# Patient Record
Sex: Male | Born: 1995 | Race: Black or African American | Hispanic: No | Marital: Single | Smoking: Never smoker
Health system: Southern US, Community
[De-identification: ages and names within clinical notes are randomized; demographics above are authoritative.]

## PROBLEM LIST (undated history)

## (undated) DIAGNOSIS — J40 Bronchitis, not specified as acute or chronic: Secondary | ICD-10-CM

## (undated) DIAGNOSIS — N4400 Torsion of testis, unspecified: Secondary | ICD-10-CM

## (undated) HISTORY — PX: OTHER SURGICAL HISTORY: SHX169

---
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
## 2014-01-22 ENCOUNTER — Emergency Department (HOSPITAL_COMMUNITY)
Admission: EM | Admit: 2014-01-22 | Discharge: 2014-01-22 | Disposition: A | Discharge status: Home / Self Care | Payer: Medicaid Other | Attending: Emergency Medicine | Admitting: Emergency Medicine

## 2014-01-22 ENCOUNTER — Encounter (HOSPITAL_COMMUNITY): Payer: Self-pay | Admitting: Emergency Medicine

## 2014-01-22 DIAGNOSIS — K0889 Other specified disorders of teeth and supporting structures: Secondary | ICD-10-CM

## 2014-01-22 DIAGNOSIS — Z8709 Personal history of other diseases of the respiratory system: Secondary | ICD-10-CM | POA: Insufficient documentation

## 2014-01-22 DIAGNOSIS — K029 Dental caries, unspecified: Secondary | ICD-10-CM | POA: Insufficient documentation

## 2014-01-22 DIAGNOSIS — K006 Disturbances in tooth eruption: Secondary | ICD-10-CM | POA: Insufficient documentation

## 2014-01-22 DIAGNOSIS — K089 Disorder of teeth and supporting structures, unspecified: Secondary | ICD-10-CM | POA: Insufficient documentation

## 2014-01-22 DIAGNOSIS — K047 Periapical abscess without sinus: Secondary | ICD-10-CM | POA: Insufficient documentation

## 2014-01-22 MED ORDER — CLINDAMYCIN HCL 150 MG PO CAPS
300.0000 mg | ORAL_CAPSULE | Freq: Once | ORAL | Status: AC
  Administered 2014-01-22: 300 mg via ORAL

## 2014-01-22 MED ORDER — CLINDAMYCIN HCL 300 MG PO CAPS: 300.0000 mg | ORAL_CAPSULE | Freq: Four times a day (QID) | ORAL | Status: DC

## 2014-01-22 MED ORDER — TRAMADOL HCL 50 MG PO TABS: 50.0000 mg | ORAL_TABLET | Freq: Four times a day (QID) | ORAL | Status: DC | PRN

## 2014-01-22 MED ORDER — TRAMADOL HCL 50 MG PO TABS
50.0000 mg | ORAL_TABLET | Freq: Once | ORAL | Status: AC
  Administered 2014-01-22: 50 mg via ORAL

## 2014-01-22 NOTE — ED Notes (Signed)
Pt has already been seen by PA and ready for d/c when seen by me  Alert, NAD

## 2014-01-22 NOTE — ED Provider Notes (Signed)
CSN: [id number]     Arrival date & time 01/22/14  1734 History   First MD Initiated Contact with Patient 01/22/14 1743     Chief Complaint  Patient presents with  . Dental Pain     (Consider location/radiation/quality/duration/timing/severity/associated sxs/prior Treatment) Patient is a 18 y.o. male presenting with tooth pain. The history is provided by the patient.  Dental Pain Location:  Lower Lower teeth location:  30/RL 1st molar Quality:  Dull and throbbing Severity:  Moderate Onset quality:  Gradual Duration:  2 days Timing:  Constant Progression:  Worsening Chronicity:  New Context: abscess, dental caries and poor dentition   Context: not dental fracture, not malocclusion and not trauma   Relieved by:  Nothing Worsened by:  Cold food/drink, hot food/drink and pressure Ineffective treatments:  None tried Associated symptoms: facial pain, facial swelling and gum swelling   Associated symptoms: no congestion, no difficulty swallowing, no drooling, no fever, no headaches, no neck pain, no neck swelling, no oral bleeding, no oral lesions and no trismus   Risk factors: lack of dental care   Risk factors: no diabetes and no smoking     Past Medical History  Diagnosis Date  . Bronchitis    History reviewed. No pertinent past surgical history. History reviewed. No pertinent family history. History  Substance Use Topics  . Smoking status: Never Smoker   . Smokeless tobacco: Not on file  . Alcohol Use: No    Review of Systems  Constitutional: Negative for fever, chills and appetite change.  HENT: Positive for dental problem and facial swelling. Negative for congestion, drooling, ear pain, mouth sores, sore throat, trouble swallowing and voice change.   Eyes: Negative for pain and visual disturbance.  Musculoskeletal: Negative for neck pain and neck stiffness.  Neurological: Negative for dizziness, facial asymmetry and headaches.  Hematological: Negative for adenopathy.    All other systems reviewed and are negative.     Allergies  Review of patient's allergies indicates no known allergies.  Home Medications   Prior to Admission medications   Not on File   BP 140/84  Pulse 84  Temp(Src) 98.3 F (36.8 C) (Oral)  Resp 18  Ht 5\' 9"  (1.753 m)  Wt 149 lb (67.586 kg)  BMI 21.99 kg/m2  SpO2 100% Physical Exam  Nursing note and vitals reviewed. Constitutional: He is oriented to person, place, and time. He appears well-developed and well-nourished. No distress.  HENT:  Head: Normocephalic and atraumatic.  Right Ear: Tympanic membrane and ear canal normal.  Left Ear: Tympanic membrane and ear canal normal.  Mouth/Throat: Uvula is midline, oropharynx is clear and moist and mucous membranes are normal. No trismus in the jaw. Dental caries present. No dental abscesses or uvula swelling.    ttp with dental caries of the right lower first molar.  Mild to moderate right lower facial edema. No fluctuance of the gums. No trismus, or sublingual abnml.    Neck: Normal range of motion. Neck supple.  Cardiovascular: Normal rate, regular rhythm and normal heart sounds.   No murmur heard. Pulmonary/Chest: Effort normal and breath sounds normal.  Musculoskeletal: Normal range of motion.  Lymphadenopathy:    He has no cervical adenopathy.  Neurological: He is alert and oriented to person, place, and time. He exhibits normal muscle tone. Coordination normal.  Skin: Skin is warm and dry.    ED Course  Procedures (including critical care time) Labs Review Labs Reviewed - No data to display  Imaging Review No  results found.   EKG Interpretation None      MDM   Final diagnoses:  Pain, dental    Pt is well appearing, non-toxic.  Likely developing periapical abscess.  No concerning sx's for infection to the floor of the mouth or deep structures of the neck.  Pt agrees to close f/u with his dentist, clindamycin and ultram for pain.  VSS.  Pt appears  stable for d/c     Tammy L. Trisha Mangleriplett, PA-C 01/22/14 1802

## 2014-01-22 NOTE — ED Notes (Signed)
Pt c/o right lower dental pain x 2 days.

## 2014-01-22 NOTE — Discharge Instructions (Signed)
Dental Pain °Toothache is pain in or around a tooth. It may get worse with chewing or with cold or heat.  °HOME CARE °· Your dentist may use a numbing medicine during treatment. If so, you may need to avoid eating until the medicine wears off. Ask your dentist about this. °· Only take medicine as told by your dentist or doctor. °· Avoid chewing food near the painful tooth until after all treatment is done. Ask your dentist about this. °GET HELP RIGHT AWAY IF:  °· The problem gets worse or new problems appear. °· You have a fever. °· There is redness and puffiness (swelling) of the face, jaw, or neck. °· You cannot open your mouth. °· There is pain in the jaw. °· There is very bad pain that is not helped by medicine. °MAKE SURE YOU:  °· Understand these instructions. °· Will watch your condition. °· Will get help right away if you are not doing well or get worse. °Document Released: 01/05/2008 Document Revised: 10/11/2011 Document Reviewed: 01/05/2008 °ExitCare® Patient Information ©2015 [employer]. This information is not intended to replace advice given to you by your health care provider. Make sure you discuss any questions you have with your health care provider. ° ° ° °Emergency Department Resource Guide °1) Find a Doctor and Pay Out of Pocket °Although you won't have to find out who is covered by your insurance plan, it is a good idea to ask around and get recommendations. You will then need to call the office and see if the doctor you have chosen will accept you as a new patient and what types of options they offer for patients who are self-pay. Some doctors offer discounts or will set up payment plans for their patients who do not have insurance, but you will need to ask so you aren't surprised when you get to your appointment. ° °2) Contact Your Local Health Department °Not all health departments have doctors that can see patients for sick visits, but many do, so it is worth a call to see if yours does.  If you don't know where your local health department is, you can check in your phone book. The CDC also has a tool to help you locate your state's health department, and many state websites also have listings of all of their local health departments. ° °3) Find a Walk-in Clinic °If your illness is not likely to be very severe or complicated, you may want to try a walk in clinic. These are popping up all over the country in pharmacies, drugstores, and shopping centers. They're usually staffed by nurse practitioners or physician assistants that have been trained to treat common illnesses and complaints. They're usually fairly quick and inexpensive. However, if you have serious medical issues or chronic medical problems, these are probably not your best option. ° °No Primary Care Doctor: °- Call [facility] at  832-8000 - they can help you locate a primary care doctor that  accepts your insurance, provides certain services, etc. °- Physician Referral Service- 1-800-533-3463 ° °Chronic Pain Problems: °Organization         Address  Phone   Notes  °[facility]  (336) 297-2271 Patients need to be referred by their primary care doctor.  ° °Medication Assistance: °Organization         Address  Phone   Notes  °[place] [facility] [address] °[place], [place] [address] (336) 641-8030 --Must be a resident   of [place] °-- Must have NO insurance coverage whatsoever (no Medicaid/ Medicare, etc.) °-- The pt. MUST have a primary care doctor that directs their care regularly and follows them in the community °  °[facility]  (866) 331-1348   °[employer]  (888) 892-1162   ° °Agencies that provide inexpensive medical care: °Organization         Address  Phone   Notes  °[facility]  (336) 832-8035   °[facility]    (336) 832-7272   °[facility] [address] °[place], [place] [address] (336) 832-4777   °[facility] [address], °[place] (336) 271-4999   °[facility]    (336) 373-0678   °[facility]    (336) 272-1050   °[facility] ° [address], [place] Phone:  (336) 832-4444, Fax:  (336) 832-4440 Hours of Operation:  9 am - 6 pm, M-F.  Also accepts Medicaid/Medicare and self-pay.  °[facility] ° [address], [place] Phone: (336) 832-3150, Fax: (336) 832-3151. Hours of Operation:  8:30 am - 5:30 pm, M-F.  Also accepts Medicaid and self-pay.  °[facility] [place] [address], [place] Phone: (336) 878-6027   °[facility] [address], [place], [place] (336)723-1848, Ext. 123 Mondays & Thursdays: 7-9 AM.  First 15 patients are seen on a first come, first serve basis. °  ° °Medicaid-accepting [place] Providers: ° °Organization         Address  Phone   Notes  °[facility] [address], [place] (336) 641-2100 Also accepts self-pay patients.  °[facility] [address], [place] ° (336) 856-9996   °[facility] [address], [place] (336) 288-8857   °[facility] [address] [place] [address], [place] (336) 299-7000   °Veita Bland [address], [place]  ° (336) 373-1557 Only accepts  Access Medicaid patients after they have their name applied to their card.  ° °Self-Pay (no insurance) in [place]: ° °Organization         Address  Phone   Notes  °Sickle Cell Patients, [facility] [address], [place] (336) 832-1970   °[facility] [address], [place] (336) 832-4400   °[facility] ° [address],  (336) 992-4800   °[facility]/Dr. Osei-Bonsu ° [address] [place] [address], [place] or [address], [place] (336) 841-8500  Phone number for both [place] and [place] locations is the same.  °[facility] [address], [place] (336) 299-0000   °[facility] [address] [place] [address], [place] or [address] (336) 852-7530 °(336) 878-2260   °[facility] [address],  (336) 350-1642, phone; (336) 294-5005, fax Sees patients 1st and 3rd Saturday of every month.  Must not qualify for public or private insurance (i.e. Medicaid, Medicare, [place] Health Choice, Veterans' Benefits) • Household income should be no more than 200% of the poverty level •The clinic cannot treat you if you are pregnant or think you are pregnant • Sexually transmitted diseases are not treated at the clinic.  ° ° °Dental Care: °Organization         Address    Phone  Notes  °[facility] [address], [place] (336) 641-6152 Accepts children up to age 21 who are enrolled in Medicaid or [place] Health Choice; pregnant women with a Medicaid card; and children who have applied for Medicaid or [place] Health Choice, but were declined, whose parents can pay a reduced fee at time of service.  °[facility] [place]  [address], [place] (336) 641-7733 Accepts children up to age 21 who are enrolled in Medicaid or [place] Health Choice; pregnant women with a Medicaid card; and children who have applied for Medicaid or [place] Health Choice, but were declined, whose parents can pay a reduced fee at time of service.  °[facility] ° [address], [place] (336) 641-4533 Patients are seen by appointment only. Walk-ins are not accepted. [facility] will see patients 18 years of age and older. °Monday - Tuesday (8am-5pm) °Most Wednesdays (8:30-5pm) °$30 per visit, cash only  °[facility] ° [address], [place] (336) 641-4533 Patients are seen by appointment  only. Walk-ins are not accepted. [facility] will see patients 18 years of age and older. °One Wednesday Evening (Monthly: Volunteer Based).  $30 per visit, cash only  °[facility]  (919) 537-3737 for adults; Children under age 4, call Graduate Pediatric Dentistry at (919) [phone]. Children aged 4-14, please call (919) [phone] to request a pediatric application. ° Dental services are provided in all areas of dental care including fillings, crowns and bridges, complete and partial dentures, implants, gum treatment, root canals, and extractions. Preventive care is also provided. Treatment is provided to both adults and children. °Patients are selected via a lottery and there is often a waiting list. °  °[facility] [address], °[place] ° (336) 763-8833 [email or web address] °  °[facility] [address], [place], [place] (336)723-1848, Ext. 123 Second and Fourth Thursday of each month, opens at 6:30 AM; Clinic ends at 9 AM.  Patients are seen on a first-come first-served basis, and a limited number are seen during each clinic.  ° °[facility] ° [address], [place], [place] (336) 723-7904   Eligibility Requirements °You must have lived in [place], [place], or [place] counties for at least the last three months. °  You cannot be eligible for state or federal sponsored healthcare insurance, including Veterans Administration, Medicaid, or Medicare. °  You generally cannot be eligible for healthcare insurance through your employer.  °  How to apply: °Eligibility screenings are held every Tuesday and Wednesday afternoon from 1:00 pm until 4:00 pm. You do not need an appointment for the interview!  °[facility] [address], [place], [place] 336-631-2330   °[facility]  336-342-8273   °[facility]  336-703-3100   °[facility]  336-570-6415   ° °Behavioral Health  Resources in the Community: °Intensive Outpatient Programs °Organization         Address  Phone  Notes  °[facility] [address], [place], [place] 336-878-6098   °[facility] [address], [place], [place] 336-832-9800   °[facility] [address], [place], [place] ° 336-882-2125   °[facility] [address],  °[place],  1-800-853-5163 or 336-641-4981   °Substance Abuse Resources °Organization           Address  Phone  Notes  °[facility]  336-882-2125   °[facility]  336-784-9470   °[facility]  336-285-9073   °[facility]  336-845-3988   °Residential & Outpatient Substance Abuse Program  1-800-659-3381   °Psychological Services °Organization         Address  Phone  Notes  °[facility]  336- 832-9600   °[facility]  336- 378-7881   °[facility] [address], [place] 1-800-853-5163 or 336-641-4981   ° °Mobile Crisis Teams °Organization         Address  Phone  Notes  °[facility], Mobile Crisis Care Unit  1-877-626-1772   °Assertive °[facility] ° [address] [place], [place] 336-834-9664   °Sharon DeEsch [address] °[place] 336-554-5454   ° °Self-Help/Support Groups °Organization         Address  Phone             Notes  °[facility] - variety of support groups  336- 373-1402 Call for more information  °Narcotics Anonymous (NA), [facility] [address], °[place]  2 meetings at this location  ° °Residential Treatment Programs °Organization         Address  Phone  Notes  °[facility] [address],    °[place]  1-866-801-8205   °[facility] ° [address], [place], [place] 704-293-8524   °[facility] [address], [place] 336-845-3988 Admissions: 8am-3pm M-F  °[facility] [address]    °[place], [place] 336-841-1104   °[facility] [address], [place], [place] 336-379-7146   °[facility] [address]  °[place], [place] 336-285-9073   °[facility] Intensive Outpatient [address], [place], [place] 336-852-3033   °[facility] [address]  °[place], [place] 1-877-615-2722 or 336-784-9470   °[facility] [address] [place], [place] 336-227-7417 Accepts Medicaid  °[facility] [address]  °[place] 1-800-659-3381 Substance Abuse/Addiction Treatment  ° °[place] [facility] Health Resources °Organization         Address  Phone  Notes  °[facility]  (888) 581-9988   °Julie Brannon, PhD [address] [place], [place]   (336) 349-5553 or (336) 951-0000   °[facility]   [address] °[place], [place] (336) 349-4454   °[facility] [address], [place], [place] (336) 342-8316 Insurance/Medicaid/sponsorship through [facility]  °[facility] [address]                                    [place], [place] (336) 342-8316 Therapy/tele-psych/case  °[facility] [address]  ° [place], [place] (336) 349-2233    °Dr. Arfeen  (336) 349-4544   °[facility] [place]  [employer] [place] [facility] 1) [address], [place] °2) [address], [place] °3)  [address], [place] (336) 349-3220 °(336) 342-7768 ° °(336) 342-8140   °[place] [facility] (336) 342-1394 or (336) 342-3537 (After Hours)    ° °  °

## 2014-01-23 NOTE — ED Provider Notes (Signed)
Medical screening examination/treatment/procedure(s) were performed by non-physician practitioner and as supervising physician I was immediately available for consultation/collaboration.   EKG Interpretation None      Devoria AlbeIva Knapp, MD, Armando GangFACEP   Ward GivensIva L Knapp, MD 01/23/14 [phone]

## 2014-04-23 ENCOUNTER — Emergency Department (HOSPITAL_COMMUNITY)
Admission: EM | Admit: 2014-04-23 | Discharge: 2014-04-23 | Disposition: A | Discharge status: Home / Self Care | Payer: Medicaid Other | Attending: Emergency Medicine | Admitting: Emergency Medicine

## 2014-04-23 ENCOUNTER — Encounter (HOSPITAL_COMMUNITY): Payer: Self-pay | Admitting: Emergency Medicine

## 2014-04-23 DIAGNOSIS — K089 Disorder of teeth and supporting structures, unspecified: Secondary | ICD-10-CM | POA: Insufficient documentation

## 2014-04-23 DIAGNOSIS — Z8669 Personal history of other diseases of the nervous system and sense organs: Secondary | ICD-10-CM | POA: Insufficient documentation

## 2014-04-23 DIAGNOSIS — R599 Enlarged lymph nodes, unspecified: Secondary | ICD-10-CM | POA: Insufficient documentation

## 2014-04-23 DIAGNOSIS — Z792 Long term (current) use of antibiotics: Secondary | ICD-10-CM | POA: Insufficient documentation

## 2014-04-23 DIAGNOSIS — K047 Periapical abscess without sinus: Secondary | ICD-10-CM | POA: Insufficient documentation

## 2014-04-23 MED ORDER — PENICILLIN V POTASSIUM 500 MG PO TABS: 500.0000 mg | ORAL_TABLET | Freq: Four times a day (QID) | ORAL | Status: DC

## 2014-04-23 MED ORDER — TRAMADOL HCL 50 MG PO TABS
50.0000 mg | ORAL_TABLET | Freq: Once | ORAL | Status: AC
  Administered 2014-04-23: 50 mg via ORAL
  Filled 2014-04-23: qty 1

## 2014-04-23 MED ORDER — TRAMADOL HCL 50 MG PO TABS: 50.0000 mg | ORAL_TABLET | Freq: Four times a day (QID) | ORAL | Status: DC | PRN

## 2014-04-23 MED ORDER — PENICILLIN V POTASSIUM 250 MG PO TABS
500.0000 mg | ORAL_TABLET | Freq: Once | ORAL | Status: AC
  Administered 2014-04-23: 500 mg via ORAL
  Filled 2014-04-23: qty 2

## 2014-04-23 NOTE — Discharge Instructions (Signed)
Dental Abscess A dental abscess is a collection of infected fluid (pus) from a bacterial infection in the inner part of the tooth (pulp). It usually occurs at the end of the tooth's root.  CAUSES   Severe tooth decay.  Trauma to the tooth that allows bacteria to enter into the pulp, such as a broken or chipped tooth. SYMPTOMS   Severe pain in and around the infected tooth.  Swelling and redness around the abscessed tooth or in the mouth or face.  Tenderness.  Pus drainage.  Bad breath.  Bitter taste in the mouth.  Difficulty swallowing.  Difficulty opening the mouth.  Nausea.  Vomiting.  Chills.  Swollen neck glands. DIAGNOSIS   A medical and dental history will be taken.  An examination will be performed by tapping on the abscessed tooth.  X-rays may be taken of the tooth to identify the abscess. TREATMENT The goal of treatment is to eliminate the infection. You may be prescribed antibiotic medicine to stop the infection from spreading. A root canal may be performed to save the tooth. If the tooth cannot be saved, it may be pulled (extracted) and the abscess may be drained.  HOME CARE INSTRUCTIONS  Only take over-the-counter or prescription medicines for pain, fever, or discomfort as directed by your caregiver.  Rinse your mouth (gargle) often with salt water ( tsp salt in 8 oz [250 ml] of warm water) to relieve pain or swelling.  Do not drive after taking pain medicine (narcotics).  Do not apply heat to the outside of your face.  Return to your dentist for further treatment as directed. SEEK MEDICAL CARE IF:  Your pain is not helped by medicine.  Your pain is getting worse instead of better. SEEK IMMEDIATE MEDICAL CARE IF:  You have a fever or persistent symptoms for more than 2-3 days.  You have a fever and your symptoms suddenly get worse.  You have chills or a very bad headache.  You have problems breathing or swallowing.  You have trouble  opening your mouth.  You have swelling in the neck or around the eye. Document Released: 07/19/2005 Document Revised: 04/12/2012 Document Reviewed: 10/27/2010 [facility] Patient Information 2015 [place], [place]. This information is not intended to replace advice given to you by your health care provider. Make sure you discuss any questions you have with your health care provider. Call the dentist first thing in the morning

## 2014-04-23 NOTE — ED Provider Notes (Signed)
CSN: [id number]     Arrival date & time 04/23/14  1913 History   First MD Initiated Contact with Patient 04/23/14 2040     Chief Complaint  Patient presents with  . Dental Pain     (Consider location/radiation/quality/duration/timing/severity/associated sxs/prior Treatment) HPI Comments: Patient with know dental cavity for several months was seen for this previously but did not follow up with DDS now with facial swelling and increased pain   Patient is a 18 y.o. male presenting with tooth pain. The history is provided by the patient.  Dental Pain Location:  Lower Lower teeth location:  30/RL 1st molar Quality:  Pulsating Severity:  Moderate Duration:  3 days Timing:  Constant Progression:  Worsening Chronicity:  Chronic Context: abscess   Relieved by:  Nothing Worsened by:  Cold food/drink Ineffective treatments:  NSAIDs Associated symptoms: facial swelling and gum swelling   Associated symptoms: no fever     Past Medical History  Diagnosis Date  . Bronchitis    History reviewed. No pertinent past surgical history. No family history on file. History  Substance Use Topics  . Smoking status: Never Smoker   . Smokeless tobacco: Not on file  . Alcohol Use: No    Review of Systems  Constitutional: Negative for fever.  HENT: Positive for facial swelling. Negative for sore throat and trouble swallowing.   Respiratory: Negative for shortness of breath.       Allergies  Review of patient's allergies indicates no known allergies.  Home Medications   Prior to Admission medications   Medication Sig Start Date End Date Taking? Authorizing Provider  clindamycin (CLEOCIN) 300 MG capsule Take 1 capsule (300 mg total) by mouth 4 (four) times daily. For 10 days 01/22/14   Tammy L. Triplett, PA-C  penicillin v potassium (VEETID) 500 MG tablet Take 1 tablet (500 mg total) by mouth 4 (four) times daily. 04/23/14   Arman Filter, NP  traMADol (ULTRAM) 50 MG tablet Take 1 tablet  (50 mg total) by mouth every 6 (six) hours as needed. 01/22/14   Tammy L. Triplett, PA-C  traMADol (ULTRAM) 50 MG tablet Take 1 tablet (50 mg total) by mouth every 6 (six) hours as needed. 04/23/14   Arman Filter, NP   BP 149/95  Pulse 93  Temp(Src) 98.7 F (37.1 C) (Oral)  Resp 14  Ht  (1.803 m)  Wt 146 lb (66.225 kg)  BMI 20.37 kg/m2  SpO2 99% Physical Exam  Nursing note and vitals reviewed. Constitutional: He appears well-developed and well-nourished.  HENT:  Mouth/Throat: Uvula is midline.    Eyes: Pupils are equal, round, and reactive to light.  Neck: Normal range of motion.  Cardiovascular: Normal rate and regular rhythm.   Pulmonary/Chest: Effort normal and breath sounds normal.  Lymphadenopathy:    He has cervical adenopathy.  Neurological: He is alert.  Skin: Skin is warm. No erythema.    ED Course  Procedures (including critical care time) Labs Review Labs Reviewed - No data to display  Imaging Review No results found.   EKG Interpretation None      MDM   Final diagnoses:  Dental abscess         Arman Filter, NP 04/23/14 2120

## 2014-04-23 NOTE — ED Notes (Signed)
Pt. reports progressing right lower molar pain with swelling onset 3 days ago unrelieved by OTC pain medication .

## 2014-04-23 NOTE — ED Notes (Signed)
PA already seen pt.  

## 2014-04-24 NOTE — ED Provider Notes (Signed)
Medical screening examination/treatment/procedure(s) were performed by non-physician practitioner and as supervising physician I was immediately available for consultation/collaboration.     Khrystal Jeanmarie, MD 04/24/14 1743 

## 2014-05-26 ENCOUNTER — Encounter (HOSPITAL_COMMUNITY): Payer: Self-pay | Admitting: Emergency Medicine

## 2014-05-26 ENCOUNTER — Emergency Department (HOSPITAL_COMMUNITY)
Admission: EM | Admit: 2014-05-26 | Discharge: 2014-05-26 | Disposition: A | Discharge status: Home / Self Care | Payer: Medicaid Other | Attending: Emergency Medicine | Admitting: Emergency Medicine

## 2014-05-26 DIAGNOSIS — Z792 Long term (current) use of antibiotics: Secondary | ICD-10-CM | POA: Insufficient documentation

## 2014-05-26 DIAGNOSIS — J029 Acute pharyngitis, unspecified: Secondary | ICD-10-CM

## 2014-05-26 MED ORDER — PENICILLIN V POTASSIUM 500 MG PO TABS: 500.0000 mg | ORAL_TABLET | Freq: Three times a day (TID) | ORAL | Status: AC

## 2014-05-26 NOTE — Discharge Instructions (Signed)

## 2014-05-26 NOTE — ED Provider Notes (Signed)
CSN: [id number]     Arrival date & time 05/26/14  0700 History  This chart was scribed for Linwood DibblesJon Kavion Mancinas, MD by Murriel HopperAlec Bankhead, ED Scribe. This patient was seen in room APA18/[facility]18 and the patient's care was started at 7:35 AM.  Chief Complaint  Patient presents with  . Sore Throat   The history is provided by the patient. No language interpreter was used.    HPI Comments: Eric Collins is a 18 y.o. male who presents to the Emergency Department complaining of a constant sore throat that began two days ago with an associated fever. Pt notes that both sides of his throat hurt, and he has had trouble swallowing and speaking secondary to pain. Pt reports that he has been taking an antibiotic that he had from a previous illness with little relief. Pt denies chest pain, abdominal pain, and diarrhea.   Past Medical History  Diagnosis Date  . Bronchitis    History reviewed. No pertinent past surgical history. No family history on file. History  Substance Use Topics  . Smoking status: Never Smoker   . Smokeless tobacco: Not on file  . Alcohol Use: No    Review of Systems  Constitutional: Positive for fever.  HENT: Positive for sore throat.   Cardiovascular: Negative for chest pain.  Gastrointestinal: Negative for abdominal pain and diarrhea.  All other systems reviewed and are negative. A complete 10 system review of systems was obtained and all systems are negative except as noted in the HPI and PMH.    Allergies  Review of patient's allergies indicates no known allergies.  Home Medications   Prior to Admission medications   Medication Sig Start Date End Date Taking? Authorizing Provider  clindamycin (CLEOCIN) 300 MG capsule Take 1 capsule (300 mg total) by mouth 4 (four) times daily. For 10 days 01/22/14   Tammy L. Triplett, PA-C  penicillin v potassium (VEETID) 500 MG tablet Take 1 tablet (500 mg total) by mouth 4 (four) times daily. 04/23/14   Arman FilterGail K Schulz, NP  traMADol (ULTRAM) 50  MG tablet Take 1 tablet (50 mg total) by mouth every 6 (six) hours as needed. 01/22/14   Tammy L. Triplett, PA-C  traMADol (ULTRAM) 50 MG tablet Take 1 tablet (50 mg total) by mouth every 6 (six) hours as needed. 04/23/14   Arman FilterGail K Schulz, NP   BP 135/93  Pulse 118  Temp(Src) 100.3 F (37.9 C) (Oral)  Resp 16  Ht 5\' 10"  (1.778 m)  Wt 146 lb (66.225 kg)  BMI 20.95 kg/m2  SpO2 96% Physical Exam  Nursing note and vitals reviewed. Constitutional: He appears well-developed and well-nourished. No distress.  HENT:  Head: Atraumatic. Macrocephalic.  Right Ear: External ear normal.  Left Ear: External ear normal.  Mouth/Throat: No trismus in the jaw. No uvula swelling. Oropharyngeal exudate, posterior oropharyngeal edema and posterior oropharyngeal erythema present.  Eyes: Conjunctivae are normal. Right eye exhibits no discharge. Left eye exhibits no discharge. No scleral icterus.  Neck: Neck supple. No tracheal deviation present.  Cardiovascular: Normal rate, regular rhythm and intact distal pulses.   Pulmonary/Chest: Effort normal and breath sounds normal. No stridor. No respiratory distress. He has no wheezes. He has no rales.  Abdominal: Soft. Bowel sounds are normal. He exhibits no distension. There is no tenderness. There is no rebound and no guarding.  Musculoskeletal: He exhibits no edema and no tenderness.  Neurological: He is alert. He has normal strength. No cranial nerve deficit (no facial droop,  extraocular movements intact, no slurred speech) or sensory deficit. He exhibits normal muscle tone. He displays no seizure activity. Coordination normal.  Skin: Skin is warm and dry. No rash noted.  Psychiatric: He has a normal mood and affect.    ED Course  Procedures (including critical care time)  DIAGNOSTIC STUDIES: Oxygen Saturation is 96% on RA, adequate by my interpretation.    COORDINATION OF CARE: 7:36 AM Discussed treatment plan with pt at bedside and pt agreed to  plan.    MDM   Final diagnoses:  Pharyngitis   Patient started treating his sore throat with oral antibiotics that he had left over. At this point it's unclear if he has a partially treated strep pharyngitis versus a viral pharyngitis. I'll prescribe him a course of oral antibiotics as strep testing may be falsely negative at this point.  I discussed the importance of taking his antibiotics completely unit in the future not starting empiric antibiotics.  I personally performed the services described in this documentation, which was scribed in my presence.  The recorded information has been reviewed and is accurate.    Linwood DibblesJon Tytianna Greenley, MD 05/26/14 [phone]

## 2014-05-26 NOTE — ED Notes (Signed)
Pt sates he has a sore throat and fever for the past 2 days. Pt was seen a month ago for a tooth infection, did not complete his antibiotics, and took leftover antibiotics yesterday.

## 2014-09-19 ENCOUNTER — Encounter (HOSPITAL_COMMUNITY)
Admission: EM | Disposition: A | Discharge status: Home / Self Care | Payer: Self-pay | Source: Home / Self Care | Attending: Emergency Medicine

## 2014-09-19 ENCOUNTER — Emergency Department (HOSPITAL_COMMUNITY): Payer: Self-pay

## 2014-09-19 ENCOUNTER — Emergency Department (HOSPITAL_COMMUNITY): Payer: Self-pay | Admitting: Anesthesiology

## 2014-09-19 ENCOUNTER — Ambulatory Visit (HOSPITAL_COMMUNITY)
Admission: EM | Admit: 2014-09-19 | Discharge: 2014-09-20 | Disposition: A | Discharge status: Home / Self Care | Payer: Self-pay | Attending: Urology | Admitting: Urology

## 2014-09-19 ENCOUNTER — Encounter (HOSPITAL_COMMUNITY): Payer: Self-pay | Admitting: *Deleted

## 2014-09-19 DIAGNOSIS — N5089 Other specified disorders of the male genital organs: Secondary | ICD-10-CM

## 2014-09-19 DIAGNOSIS — N4400 Torsion of testis, unspecified: Secondary | ICD-10-CM | POA: Diagnosis present

## 2014-09-19 HISTORY — PX: ORCHIECTOMY: SHX2116

## 2014-09-19 LAB — I-STAT CHEM 8, ED
BUN: 12 mg/dL (ref 6–23)
CALCIUM ION: 1.2 mmol/L (ref 1.12–1.23)
Chloride: 100 mmol/L (ref 96–112)
Creatinine, Ser: 0.8 mg/dL (ref 0.50–1.35)
Glucose, Bld: 98 mg/dL (ref 70–99)
HEMATOCRIT: 48 % (ref 39.0–52.0)
Hemoglobin: 16.3 g/dL (ref 13.0–17.0)
POTASSIUM: 3.5 mmol/L (ref 3.5–5.1)
SODIUM: 140 mmol/L (ref 135–145)
TCO2: 23 mmol/L (ref 0–100)

## 2014-09-19 LAB — CBC WITH DIFFERENTIAL/PLATELET
Basophils Absolute: 0 10*3/uL (ref 0.0–0.1)
Basophils Relative: 0 % (ref 0–1)
EOS PCT: 0 % (ref 0–5)
Eosinophils Absolute: 0 10*3/uL (ref 0.0–0.7)
HCT: 43.1 % (ref 39.0–52.0)
Hemoglobin: 14 g/dL (ref 13.0–17.0)
LYMPHS PCT: 17 % (ref 12–46)
Lymphs Abs: 1.9 10*3/uL (ref 0.7–4.0)
MCH: 26.1 pg (ref 26.0–34.0)
MCHC: 32.5 g/dL (ref 30.0–36.0)
MCV: 80.4 fL (ref 78.0–100.0)
Monocytes Absolute: 0.9 10*3/uL (ref 0.1–1.0)
Monocytes Relative: 8 % (ref 3–12)
Neutro Abs: 8.3 10*3/uL — ABNORMAL HIGH (ref 1.7–7.7)
Neutrophils Relative %: 75 % (ref 43–77)
PLATELETS: 180 10*3/uL (ref 150–400)
RBC: 5.36 MIL/uL (ref 4.22–5.81)
RDW: 12.2 % (ref 11.5–15.5)
WBC: 11.2 10*3/uL — AB (ref 4.0–10.5)

## 2014-09-19 LAB — URINALYSIS, ROUTINE W REFLEX MICROSCOPIC
Bilirubin Urine: NEGATIVE
GLUCOSE, UA: NEGATIVE mg/dL
HGB URINE DIPSTICK: NEGATIVE
LEUKOCYTES UA: NEGATIVE
Nitrite: NEGATIVE
PH: 6 (ref 5.0–8.0)
Protein, ur: NEGATIVE mg/dL
Specific Gravity, Urine: >1.03 — ABNORMAL HIGH (ref 1.005–1.030)
Urobilinogen, UA: 4 mg/dL — ABNORMAL HIGH (ref 0.0–1.0)

## 2014-09-19 LAB — PROTIME-INR
INR: 1.09 (ref 0.00–1.49)
Prothrombin Time: 14.2 seconds (ref 11.6–15.2)

## 2014-09-19 SURGERY — ORCHIECTOMY
Anesthesia: General

## 2014-09-19 SURGERY — ORCHIECTOMY
Anesthesia: General | Site: Scrotum

## 2014-09-19 MED ORDER — EPHEDRINE SULFATE 50 MG/ML IJ SOLN
INTRAMUSCULAR | Status: AC
  Filled 2014-09-19: qty 1

## 2014-09-19 MED ORDER — ROCURONIUM BROMIDE 50 MG/5ML IV SOLN
INTRAVENOUS | Status: AC
  Filled 2014-09-19: qty 1

## 2014-09-19 MED ORDER — MIDAZOLAM HCL 2 MG/2ML IJ SOLN
INTRAMUSCULAR | Status: AC
  Filled 2014-09-19: qty 2

## 2014-09-19 MED ORDER — GLYCOPYRROLATE 0.2 MG/ML IJ SOLN
INTRAMUSCULAR | Status: AC
  Filled 2014-09-19: qty 1

## 2014-09-19 MED ORDER — ONDANSETRON HCL 4 MG/2ML IJ SOLN
INTRAMUSCULAR | Status: AC
  Filled 2014-09-19: qty 2

## 2014-09-19 MED ORDER — ROCURONIUM BROMIDE 100 MG/10ML IV SOLN
INTRAVENOUS | Status: DC | PRN
  Administered 2014-09-19: 10 mg via INTRAVENOUS

## 2014-09-19 MED ORDER — ARTIFICIAL TEARS OP OINT
TOPICAL_OINTMENT | OPHTHALMIC | Status: AC
  Filled 2014-09-19: qty 3.5

## 2014-09-19 MED ORDER — FENTANYL CITRATE 0.05 MG/ML IJ SOLN
INTRAMUSCULAR | Status: AC
Start: 2014-09-19 — End: 2014-09-19
  Filled 2014-09-19: qty 5

## 2014-09-19 MED ORDER — ARTIFICIAL TEARS OP OINT
TOPICAL_OINTMENT | OPHTHALMIC | Status: DC | PRN
  Administered 2014-09-19: 1 via OPHTHALMIC

## 2014-09-19 MED ORDER — LIDOCAINE HCL (CARDIAC) 20 MG/ML IV SOLN
INTRAVENOUS | Status: DC | PRN
  Administered 2014-09-19: 50 mg via INTRAVENOUS

## 2014-09-19 MED ORDER — ONDANSETRON HCL 4 MG/2ML IJ SOLN
INTRAMUSCULAR | Status: DC | PRN
Start: 2014-09-19 — End: 2014-09-19
  Administered 2014-09-19: 4 mg via INTRAVENOUS

## 2014-09-19 MED ORDER — NEOSTIGMINE METHYLSULFATE 10 MG/10ML IV SOLN
INTRAVENOUS | Status: DC | PRN
  Administered 2014-09-19: 4 mg via INTRAVENOUS

## 2014-09-19 MED ORDER — SODIUM CHLORIDE 0.9 % IV SOLN
INTRAVENOUS | Status: DC
  Administered 2014-09-19 (×2): via INTRAVENOUS

## 2014-09-19 MED ORDER — PROPOFOL 10 MG/ML IV BOLUS
INTRAVENOUS | Status: AC
  Filled 2014-09-19: qty 20

## 2014-09-19 MED ORDER — FENTANYL CITRATE 0.05 MG/ML IJ SOLN
INTRAMUSCULAR | Status: DC | PRN
  Administered 2014-09-19 (×4): 50 ug via INTRAVENOUS
  Administered 2014-09-19: 100 ug via INTRAVENOUS
  Administered 2014-09-19: 50 ug via INTRAVENOUS

## 2014-09-19 MED ORDER — MIDAZOLAM HCL 5 MG/5ML IJ SOLN
INTRAMUSCULAR | Status: DC | PRN
  Administered 2014-09-19: 2 mg via INTRAVENOUS

## 2014-09-19 MED ORDER — CEFAZOLIN SODIUM-DEXTROSE 2-3 GM-% IV SOLR
INTRAVENOUS | Status: AC
  Filled 2014-09-19: qty 50

## 2014-09-19 MED ORDER — CEFAZOLIN SODIUM-DEXTROSE 2-3 GM-% IV SOLR
INTRAVENOUS | Status: DC | PRN
  Administered 2014-09-19: 2 g via INTRAVENOUS

## 2014-09-19 MED ORDER — LIDOCAINE HCL (PF) 1 % IJ SOLN
INTRAMUSCULAR | Status: AC
  Filled 2014-09-19: qty 5

## 2014-09-19 MED ORDER — MORPHINE SULFATE 4 MG/ML IJ SOLN
2.0000 mg | INTRAMUSCULAR | Status: DC | PRN
  Administered 2014-09-19: 2 mg via INTRAVENOUS
  Filled 2014-09-19: qty 1

## 2014-09-19 MED ORDER — FENTANYL CITRATE 0.05 MG/ML IJ SOLN
INTRAMUSCULAR | Status: AC
  Filled 2014-09-19: qty 2

## 2014-09-19 MED ORDER — ACETAMINOPHEN 325 MG PO TABS: 650.0000 mg | ORAL_TABLET | Freq: Four times a day (QID) | ORAL | Status: DC | PRN

## 2014-09-19 MED ORDER — PROPOFOL 10 MG/ML IV BOLUS
INTRAVENOUS | Status: DC | PRN
  Administered 2014-09-19: 160 mg via INTRAVENOUS

## 2014-09-19 MED ORDER — 0.9 % SODIUM CHLORIDE (POUR BTL) OPTIME
TOPICAL | Status: DC | PRN
  Administered 2014-09-19: 1000 mL

## 2014-09-19 MED ORDER — HYDROCODONE-ACETAMINOPHEN 5-325 MG PO TABS
1.0000 | ORAL_TABLET | ORAL | Status: DC | PRN
  Administered 2014-09-19 – 2014-09-20 (×2): 2 via ORAL
  Filled 2014-09-19 (×2): qty 2

## 2014-09-19 MED ORDER — SODIUM CHLORIDE 0.9 % IJ SOLN
INTRAMUSCULAR | Status: AC
  Filled 2014-09-19: qty 10

## 2014-09-19 MED ORDER — SUCCINYLCHOLINE CHLORIDE 20 MG/ML IJ SOLN
INTRAMUSCULAR | Status: DC | PRN
  Administered 2014-09-19: 120 mg via INTRAVENOUS

## 2014-09-19 MED ORDER — GLYCOPYRROLATE 0.2 MG/ML IJ SOLN
INTRAMUSCULAR | Status: DC | PRN
  Administered 2014-09-19: 0.2 mg via INTRAVENOUS
  Administered 2014-09-19: 0.6 mg via INTRAVENOUS

## 2014-09-19 MED ORDER — SUCCINYLCHOLINE CHLORIDE 20 MG/ML IJ SOLN
INTRAMUSCULAR | Status: AC
  Filled 2014-09-19: qty 1

## 2014-09-19 MED ORDER — ONDANSETRON HCL 4 MG/2ML IJ SOLN: 4.0000 mg | Freq: Three times a day (TID) | INTRAMUSCULAR | Status: DC

## 2014-09-19 SURGICAL SUPPLY — 28 items
BLADE SURG 15 STRL LF DISP TIS (BLADE) ×2 IMPLANT
BLADE SURG 15 STRL SS (BLADE) ×4
BNDG GAUZE ELAST 4 BULKY (GAUZE/BANDAGES/DRESSINGS) ×3 IMPLANT
CLOTH BEACON ORANGE TIMEOUT ST (SAFETY) ×3 IMPLANT
DRESSING TELFA 8X3 (GAUZE/BANDAGES/DRESSINGS) ×3 IMPLANT
ELECT NEEDLE TIP 2.8 STRL (NEEDLE) ×3 IMPLANT
ELECT REM PT RETURN 9FT ADLT (ELECTROSURGICAL) ×6
ELECTRODE REM PT RTRN 9FT ADLT (ELECTROSURGICAL) ×2 IMPLANT
GLOVE BIO SURGEON STRL SZ7.5 (GLOVE) ×3 IMPLANT
GLOVE BIOGEL PI IND STRL 7.0 (GLOVE) ×2 IMPLANT
GLOVE BIOGEL PI INDICATOR 7.0 (GLOVE) ×4
GLOVE ECLIPSE 8.0 STRL XLNG CF (GLOVE) ×3 IMPLANT
GLOVE OPTIFIT SS 6.5 STRL BRWN (GLOVE) ×3 IMPLANT
GOWN STRL REUS W/ TWL XL LVL3 (GOWN DISPOSABLE) ×1 IMPLANT
GOWN STRL REUS W/TWL XL LVL3 (GOWN DISPOSABLE) ×2
MANIFOLD NEPTUNE II (INSTRUMENTS) ×3 IMPLANT
MARKER SKIN DUAL TIP RULER LAB (MISCELLANEOUS) ×3 IMPLANT
NEEDLE HYPO 25X5/8 SAFETYGLIDE (NEEDLE) ×3 IMPLANT
PACK MINOR (CUSTOM PROCEDURE TRAY) ×3 IMPLANT
PAD ABD 5X9 TENDERSORB (GAUZE/BANDAGES/DRESSINGS) ×3 IMPLANT
SET BASIN LINEN APH (SET/KITS/TRAYS/PACK) ×3 IMPLANT
SPONGE GAUZE 4X4 12PLY (GAUZE/BANDAGES/DRESSINGS) ×3 IMPLANT
SUT CHROMIC 3 0 SH 27 (SUTURE) ×12 IMPLANT
SUT PROLENE 4 0 RB 1 (SUTURE) ×6
SUT PROLENE 4-0 RB1 .5 CRCL 36 (SUTURE) ×3 IMPLANT
SUT SILK 0 FSL (SUTURE) ×9 IMPLANT
SUT SILK 2 0 SH (SUTURE) ×3 IMPLANT
SYR CONTROL 10ML LL (SYRINGE) ×3 IMPLANT

## 2014-09-19 NOTE — Transfer of Care (Signed)
Immediate Anesthesia Transfer of Care Note  Patient: Eric Collins  Procedure(s) Performed: Procedure(s): SCROTAL EXPLORATION,  RIGHT ORCHIECTOMY, LEFT ORCHIOPEXY (N/A)  Patient Location: PACU  Anesthesia Type:General  Level of Consciousness: awake, oriented and patient cooperative  Airway & Oxygen Therapy: Patient Spontanous Breathing and Patient connected to face mask oxygen  Post-op Assessment: Report given to RN and Post -op Vital signs reviewed and stable  Post vital signs: Reviewed and stable  Last Vitals:  Filed Vitals:   09/19/14 1444  BP: 113/74  Pulse: 92  Temp: 37.1 C  Resp: 20    Complications: No apparent anesthesia complications

## 2014-09-19 NOTE — Anesthesia Procedure Notes (Signed)
Procedure Name: Intubation Date/Time: 09/19/2014 6:09 PM Performed by: Pernell DupreADAMS, AMY A Pre-anesthesia Checklist: Patient identified, Patient being monitored, Timeout performed, Emergency Drugs available and Suction available Patient Re-evaluated:Patient Re-evaluated prior to inductionOxygen Delivery Method: Circle System Utilized Preoxygenation: Pre-oxygenation with 100% oxygen Intubation Type: IV induction, Rapid sequence and Cricoid Pressure applied Laryngoscope Size: 3 and Miller Grade View: Grade I Tube type: Oral Tube size: 7.0 mm Number of attempts: 1 Airway Equipment and Method: Stylet Placement Confirmation: ETT inserted through vocal cords under direct vision,  positive ETCO2 and breath sounds checked- equal and bilateral Secured at: 21 cm Tube secured with: Tape Dental Injury: Teeth and Oropharynx as per pre-operative assessment

## 2014-09-19 NOTE — Anesthesia Preprocedure Evaluation (Signed)
Anesthesia Evaluation Anesthesia Physical Anesthesia Plan Anesthesia Quick Evaluation  

## 2014-09-19 NOTE — ED Notes (Signed)
Lt testicular swelling for 2 days, NO d/c  No fever, no injury

## 2014-09-19 NOTE — Anesthesia Postprocedure Evaluation (Signed)
  Anesthesia Post-op Note  Patient: Eric Collins  Procedure(s) Performed: Procedure(s): SCROTAL EXPLORATION,  RIGHT ORCHIECTOMY, LEFT ORCHIOPEXY (N/A)  Patient Location: PACU  Anesthesia Type:General  Level of Consciousness: awake, alert , oriented and patient cooperative  Airway and Oxygen Therapy: Patient Spontanous Breathing  Post-op Pain: mild  Post-op Assessment: Post-op Vital signs reviewed, Patient's Cardiovascular Status Stable, Respiratory Function Stable, Patent Airway, No signs of Nausea or vomiting and Pain level controlled  Post-op Vital Signs: Reviewed and stable  Last Vitals:  Filed Vitals:   09/19/14 1930  BP: 145/85  Pulse: 110  Temp: 36.4 C  Resp: 16    Complications: No apparent anesthesia complications

## 2014-09-19 NOTE — ED Provider Notes (Signed)
CSN: [id number]     Arrival date & time 09/19/14  1431 History   First MD Initiated Contact with Patient 09/19/14 1606     No chief complaint on file.     HPI Pt was seen at 1610. Per pt, c/o sudden onset and worsening of persistent right testicle "pain" and "swelling" for the past 2 days. Pt states he developed a "sharp" pain in his right testicle while having sex 2 days ago. Pt states he was able to complete having sex without further pain. Pt states yesterday his right testicle "started to swell up" and "hurt," but pt did not seek medical attention because he "was driving around a lot a thought I was just sitting on it." States his family recommended he come to the ED today for evaluation due to continued "swelling" and "pain." Denies dysuria/hematuria, no penile discharge, no scrotal rash, no abd pain, no back pain, no N/V/D, no fevers.    Past Medical History  Diagnosis Date  . Bronchitis    History reviewed. No pertinent past surgical history.  History  Substance Use Topics  . Smoking status: Never Smoker   . Smokeless tobacco: Not on file  . Alcohol Use: No    Review of Systems ROS: Statement: All systems negative except as marked or noted in the HPI; Constitutional: Negative for fever and chills. ; ; Eyes: Negative for eye pain, redness and discharge. ; ; ENMT: Negative for ear pain, hoarseness, nasal congestion, sinus pressure and sore throat. ; ; Cardiovascular: Negative for chest pain, palpitations, diaphoresis, dyspnea and peripheral edema. ; ; Respiratory: Negative for cough, wheezing and stridor. ; ; Gastrointestinal: Negative for nausea, vomiting, diarrhea, abdominal pain, blood in stool, hematemesis, jaundice and rectal bleeding. . ; ; Genitourinary: Negative for dysuria, flank pain and hematuria. ; ; Genital:  No penile drainage or rash, +right testicular pain and swelling, no scrotal rash. ;; Musculoskeletal: Negative for back pain and neck pain. Negative for swelling and  trauma.; ; Skin: Negative for pruritus, rash, abrasions, blisters, bruising and skin lesion.; ; Neuro: Negative for headache, lightheadedness and neck stiffness. Negative for weakness, altered level of consciousness , altered mental status, extremity weakness, paresthesias, involuntary movement, seizure and syncope.      Allergies  Review of patient's allergies indicates no known allergies.  Home Medications   Prior to Admission medications   Medication Sig Start Date End Date Taking? Authorizing Provider  penicillin v potassium (VEETID) 500 MG tablet Take 1 tablet (500 mg total) by mouth 3 (three) times daily. 05/26/14   Linwood Dibbles, MD  traMADol (ULTRAM) 50 MG tablet Take 1 tablet (50 mg total) by mouth every 6 (six) hours as needed. 01/22/14   Tammy L. Triplett, PA-C  traMADol (ULTRAM) 50 MG tablet Take 1 tablet (50 mg total) by mouth every 6 (six) hours as needed. 04/23/14   Arman Filter, NP   BP 113/74 mmHg  Pulse 92  Temp(Src) 98.7 F (37.1 C) (Oral)  Resp 20  Ht  (1.778 m)  Wt 138 lb (62.596 kg)  BMI 19.80 kg/m2  SpO2 100% Physical Exam  1610: Physical examination:  Nursing notes reviewed; Vital signs and O2 SAT reviewed;  Constitutional: Well developed, Well nourished, Well hydrated, In no acute distress; Head:  Normocephalic, atraumatic; Eyes: EOMI, PERRL, No scleral icterus; ENMT: Mouth and pharynx normal, Mucous membranes moist; Neck: Supple, Full range of motion, No lymphadenopathy; Cardiovascular: Regular rate and rhythm, No murmur, rub, or gallop; Respiratory: Breath sounds  clear & equal bilaterally, No rales, rhonchi, wheezes.  Speaking full sentences with ease, Normal respiratory effort/excursion; Chest: Nontender, Movement normal; Abdomen: Soft, Nontender, Nondistended, Normal bowel sounds; Genitourinary: No CVA tenderness. Genital exam performed with pt permission and male ED RN chaperone present during exam. No perineal erythema.  No penile lesions or drainage.  No  scrotal erythema or rash. +right scrotal and testicular edema and tenderness to palp. No left testicular tenderness to palp.  +cremasteric reflexes bilat.  No inguinal LAN or palpable masses..; Extremities: Pulses normal, No tenderness, No edema, No calf edema or asymmetry.; Neuro: AA&Ox3, Major CN grossly intact.  Speech clear. No gross focal motor or sensory deficits in extremities. Climbs on and off stretcher easily by himself. Gait steady.; Skin: Color normal, Warm, Dry.   ED Course  Procedures   1615:  Pt immediately moved from the waiting room to ED Room 11 after ultrasound completed. T/C to Urology Dr. Sherron Monday, case discussed, including:  HPI, pertinent PM/SHx, VS/PE, dx testing, ED course and treatment:  Agreeable to admit, requests to call the OR b/c pt needs emergent surgery. IV placed, labs drawn. Pt updated regarding plan of care; verb understanding.      MDM  MDM Reviewed: nursing note and vitals Interpretation: ultrasound and labs Total time providing critical care: 30-74 minutes. This excludes time spent performing separately reportable procedures and services. Consults: urology   CRITICAL CARE Performed by: Laray Anger Total critical care time: 35 Critical care time was exclusive of separately billable procedures and treating other patients. Critical care was necessary to treat or prevent imminent or life-threatening deterioration. Critical care was time spent personally by me on the following activities: development of treatment plan with patient and/or surrogate as well as nursing, discussions with consultants, evaluation of patient's response to treatment, examination of patient, obtaining history from patient or surrogate, ordering and performing treatments and interventions, ordering and review of laboratory studies, ordering and review of radiographic studies, pulse oximetry and re-evaluation of patient's condition.   US Scrotum 09/19/2014   CLINICAL DATA:   Right testicular swelling x2 days.  EXAM: SCROTAL ULTRASOUND  DOPPLER ULTRASOUND OF THE TESTICLES  TECHNIQUE: Complete ultrasound examination of the testicles, epididymis, and other scrotal structures was performed. Color and spectral Doppler ultrasound were also utilized to evaluate blood flow to the testicles.  COMPARISON:  None.  FINDINGS: Right testicle  Measurements: 4.9 x 2.6 x 3.5 cm. Heterogeneous echotexture noted throughout the right testicle with testicular edema. Scrotal edema, epididymal edema, a right hydrocele with debris and septations noted. No internal blood flow noted in the testicle. Findings are consistent with testicular torsion. Infiltrating tumor and/or infectious process cannot be excluded.  Left testicle  Measurements: 4.7 x 2.9 x 4.5 cm. No mass or microlithiasis visualized.  Right epididymis:  As above.  Left epididymis:  Normal in size and appearance.  Hydrocele:  As above.  No hydrocele on the left.  Varicocele:  None visualized.  Pulsed Doppler interrogation of both testes demonstrates no flow noted within the right testicle as noted above. Normal left testicular flow.  IMPRESSION: Grossly abnormal right testicle with heterogeneous testicular echotexture with no internal blood flow. Right scrotal edema, epididymal edema, and complex hydrocele with debris and septations noted. These findings are consistent with testicular torsion. Infiltrating tumor and/or infectious process cannot be excluded. These results were called by telephone at the time of interpretation on 09/19/2014 at 4:15 pm to Dr. Samuel Jester , who verbally acknowledged these results.   Electronically  Signed   By: Maisie Fushomas  Register   On: 09/19/2014 16:16    Results for orders placed or performed during the hospital encounter of 09/19/14  Urinalysis, Routine w reflex microscopic  Result Value Ref Range   Color, Urine YELLOW YELLOW   APPearance CLEAR CLEAR   Specific Gravity, Urine >1.030 (H) 1.005 - 1.030   pH  6.0 5.0 - 8.0   Glucose, UA NEGATIVE NEGATIVE mg/dL   Hgb urine dipstick NEGATIVE NEGATIVE   Bilirubin Urine NEGATIVE NEGATIVE   Ketones, ur TRACE (A) NEGATIVE mg/dL   Protein, ur NEGATIVE NEGATIVE mg/dL   Urobilinogen, UA 4.0 (H) 0.0 - 1.0 mg/dL   Nitrite NEGATIVE NEGATIVE   Leukocytes, UA NEGATIVE NEGATIVE  Protime-INR  Result Value Ref Range   Prothrombin Time 14.2 11.6 - 15.2 seconds   INR 1.09 0.00 - 1.49  CBC with Differential  Result Value Ref Range   WBC 11.2 (H) 4.0 - 10.5 K/uL   RBC 5.36 4.22 - 5.81 MIL/uL   Hemoglobin 14.0 13.0 - 17.0 g/dL   HCT 81.143.1 91.439.0 - 78.252.0 %   MCV 80.4 78.0 - 100.0 fL   MCH 26.1 26.0 - 34.0 pg   MCHC 32.5 30.0 - 36.0 g/dL   RDW 95.612.2 21.311.5 - 08.615.5 %   Platelets 180 150 - 400 K/uL   Neutrophils Relative % 75 43 - 77 %   Neutro Abs 8.3 (H) 1.7 - 7.7 K/uL   Lymphocytes Relative 17 12 - 46 %   Lymphs Abs 1.9 0.7 - 4.0 K/uL   Monocytes Relative 8 3 - 12 %   Monocytes Absolute 0.9 0.1 - 1.0 K/uL   Eosinophils Relative 0 0 - 5 %   Eosinophils Absolute 0.0 0.0 - 0.7 K/uL   Basophils Relative 0 0 - 1 %   Basophils Absolute 0.0 0.0 - 0.1 K/uL  I-stat Chem 8, ED  Result Value Ref Range   Sodium 140 135 - 145 mmol/L   Potassium 3.5 3.5 - 5.1 mmol/L   Chloride 100 96 - 112 mmol/L   BUN 12 6 - 23 mg/dL   Creatinine, Ser 5.780.80 0.50 - 1.35 mg/dL   Glucose, Bld 98 70 - 99 mg/dL   Calcium, Ion 4.691.20 1.12 - 1.23 mmol/L   TCO2 23 0 - 100 mmol/L   Hemoglobin 16.3 13.0 - 17.0 g/dL   HCT 62.948.0 52.839.0 - 41.352.0 %       Samuel JesterKathleen Nathanuel Cabreja, DO 09/21/14 [id number]

## 2014-09-19 NOTE — Op Note (Signed)
Preoperative diagnosis: Right testis torsion Postoperative diagnosis: Right testis torsion Surgery: Scrotal exploration; left orchiopexy; right orchiectomy Surgeon: Dr. Lorin PicketScott Ellwood Steidle  The patient has the above diagnoses and consented above procedure. I was very concerned that the patient preoperatively would need an orchiectomy and this was discussed thoroughly with sequelae  Patient was prepped and draped in usual fashion in supine position. Appropriate antibiotics were given.  Left testicle was supple and normal. He had a right hard testicle. Midline raphae incision approximate 4-5 cm in length was marked. With appropriate retraction I used a scalpel and Metzenbaum scissors to divide layers getting excellent hemostasis through the dartos fascia and opening the tunica vaginalis on the right and delivering a fully twisted black testicle and epididymis. It was twisted probably 360 or greater. It was untwisted and laid in a moist warm towel  With appropriate traction I delivered the left testicle through the same incision opening the tunica vaginalis. I actually marked the upper pole of the left testicle and held it in place in the anatomic position throughout the entire case. I had excellent help and retraction throughout the case. I developed a nice scrotal pouch. I placed a 4-0 Prolene suture at 3, 6 and 9:00 in the deep left scrotum not entering the skin. I placed the suture in an anatomic position through the tunica albugunia in the appropriate corresponding area not going too deep. The testicle was then placed in the deep scrotum and with good retraction I gently tied down all 3 sutures. I was very happy with the orchiopexy and throughout the case the testicle was soft supple and normal color  It was obvious from the beginning that the patient needed an orchiectomy. I skeletonized the cord modestly in the mid cord and high right hemiscrotum. I divided the cord in 2 sections and placed a Kelly  clamp across each. I cut the spermatic cord delivering the testicle that was sent for pathology. I suture ligated each half of the cord with 0 silk. I did a third free tie on one of the 2 cords. Hemostasis was checked many minutes later and was excellent.  Irrigation was utilized. A Babcock was placed at each apex cephalad and caudally. I used a 3-0 chromic running suture and ran the dartos fascia to the corresponding other side picking up the septum in the midline. I then closed the skin with interrupted 3-0 chromic. Pressure dressing was applied. Hemostasis is excellent. Scrotum was soft and supple at the end of the case. Patient was taken to recovery. I we'll keep the patient in for observation overnight

## 2014-09-19 NOTE — Consult Note (Signed)
Urology Consult  Referring physician: Merlene Laughter Reason for referral: Torsion  Chief Complaint: Rt testes torsion  History of Present Illness: 19 year old male with 2 plus day history of rt testis pain started after intercourse; pain comes and goes; no pain at rest; associate swelling on Right; no dysuris or UTI symptoms; no GU history/ ? Issues as a child with testes Modifying factors: There are no other modifying factors  Associated signs and symptoms: There are no other associated signs and symptoms Aggravating and relieving factors: There are no other aggravating or relieving factors Severity: Moderate Duration: Persistent  Past Medical History  Diagnosis Date  . Bronchitis    History reviewed. No pertinent past surgical history.  Medications: I have reviewed the patient's current medications. Allergies: No Known Allergies  History reviewed. No pertinent family history. Social History:  reports that he has never smoked. He does not have any smokeless tobacco history on file. He reports that he does not drink alcohol or use illicit drugs.  ROS: All systems are reviewed and negative except as noted. Rest negative  Physical Exam:  Vital signs in last 24 hours: Temp:  [98.7 F (37.1 C)] 98.7 F (37.1 C) (02/18 1444) Pulse Rate:  [92] 92 (02/18 1444) Resp:  [20] 20 (02/18 1444) BP: (113)/(74) 113/74 mmHg (02/18 1444) SpO2:  [100 %] 100 % (02/18 1444) Weight:  [62.596 kg (138 lb)] 62.596 kg (138 lb) (02/18 1444)  Cardiovascular: Skin warm; not flushed Respiratory: Breaths quiet; no shortness of breath Abdomen: No masses Neurological: Normal sensation to touch Musculoskeletal: Normal motor function arms and legs Lymphatics: No inguinal adenopathy Skin: No rashes Genitourinary:normal soft palpable left testis; swollen and firm right testis and tender with no cellulitis; lie of enlarged testis appears abnomal  Laboratory Data:  Results for orders placed or performed during  the hospital encounter of 09/19/14 (from the past 72 hour(s))  I-stat Chem 8, ED     Status: None   Collection Time: 09/19/14  4:48 PM  Result Value Ref Range   Sodium 140 135 - 145 mmol/L   Potassium 3.5 3.5 - 5.1 mmol/L   Chloride 100 96 - 112 mmol/L   BUN 12 6 - 23 mg/dL   Creatinine, Ser 9.60 0.50 - 1.35 mg/dL   Glucose, Bld 98 70 - 99 mg/dL   Calcium, Ion 4.54 1.12 - 1.23 mmol/L   TCO2 23 0 - 100 mmol/L   Hemoglobin 16.3 13.0 - 17.0 g/dL   HCT 09.8 11.9 - 14.7 %   No results found for this or any previous visit (from the past 240 hour(s)). Creatinine:  Recent Labs  09/19/14 1648  CREATININE 0.80    Xrays: See report/chart 4.9 x 2.6 x 3.5 heterogenous testis with no blood flow on left; swollen epididymis as noted; normal size and blood flow to left testis; hydrocele noted  Impression/Assessment:  Rt testis torsion; late presentation; prognosis of testis may be limited;   Plan:  Discussed scrotal exploration and detorsion and bilateral orchiopexy with patient; discussed in detail he may need an orchiectomy with sequelae; sequelae of situation described; pros and cons and risks described including injury to other testes and sequelae After a thorough review of the management options for the patient's condition the patient  elected to proceed with surgical therapy as noted above. We have discussed the potential benefits and risks of the procedure, side effects of the proposed treatment, the likelihood of the patient achieving the goals of the procedure, and any  potential problems that might occur during the procedure or recuperation. Informed consent has been obtained.  Kambrie Eddleman A 09/19/2014, 5:01 PM

## 2014-09-19 NOTE — Anesthesia Preprocedure Evaluation (Signed)
Anesthesia Evaluation  Patient identified by MRN, date of birth, ID band Patient awake    Reviewed: H&P , NPO status , Patient's Chart, lab work & pertinent test results, reviewed documented beta blocker date and time   History of Anesthesia Complications Negative for: history of anesthetic complications  Airway Mallampati: I  TM Distance: >3 FB Neck ROM: full    Dental  (+) Teeth Intact   Pulmonary neg pulmonary ROS,  breath sounds clear to auscultation  Pulmonary exam normal       Cardiovascular negative cardio ROS  Rhythm:regular     Neuro/Psych negative neurological ROS  negative psych ROS   GI/Hepatic Neg liver ROS, Last po at 1pm today   Endo/Other    Renal/GU    Testicular torsion    Musculoskeletal   Abdominal   Peds  Hematology negative hematology ROS (+)   Anesthesia Other Findings   Reproductive/Obstetrics negative OB ROS                             Anesthesia Physical Anesthesia Plan  ASA: I and emergent  Anesthesia Plan: General ETT, Rapid Sequence and Cricoid Pressure   Post-op Pain Management:    Induction:   Airway Management Planned:   Additional Equipment:   Intra-op Plan:   Post-operative Plan:   Informed Consent: I have reviewed the patients History and Physical, chart, labs and discussed the procedure including the risks, benefits and alternatives for the proposed anesthesia with the patient or authorized representative who has indicated his/her understanding and acceptance.   Dental Advisory Given  Plan Discussed with: Anesthesiologist and Surgeon  Anesthesia Plan Comments:         Anesthesia Quick Evaluation

## 2014-09-20 ENCOUNTER — Encounter (HOSPITAL_COMMUNITY): Payer: Self-pay | Admitting: Urology

## 2014-09-20 MED ORDER — HYDROCODONE-ACETAMINOPHEN 5-325 MG PO TABS: 1.0000 | ORAL_TABLET | Freq: Four times a day (QID) | ORAL | Status: AC | PRN

## 2014-09-20 MED ORDER — CEPHALEXIN 500 MG PO CAPS: 500.0000 mg | ORAL_CAPSULE | Freq: Three times a day (TID) | ORAL | Status: AC

## 2014-09-20 NOTE — Discharge Summary (Signed)
Date of admission: 09/19/2014  Date of discharge: 09/20/2014  Admission diagnosis: Testis torsion  Discharge diagnosis: Testis torsion  Secondary diagnoses: None  History and Physical: For full details, please see admission history and physical. Briefly, Eric Collins is a 19 y.o. year old patient with above diagnosis.   Hospital Course: Rt orchiectomy and left orchiopexy. Excellent post op course  Laboratory values:  Recent Labs  09/19/14 1635 09/19/14 1648  HGB 14.0 16.3  HCT 43.1 48.0    Recent Labs  09/19/14 1648  CREATININE 0.80    Disposition: Home  Discharge instruction: The patient was instructed to be ambulatory but told to refrain from heavy lifting, strenuous activity, or driving. Described in detail.  Discharge medications:    Medication List    STOP taking these medications        traMADol 50 MG tablet  Commonly known as:  ULTRAM      TAKE these medications        cephALEXin 500 MG capsule  Commonly known as:  KEFLEX  Take 1 capsule (500 mg total) by mouth 3 (three) times daily.     HYDROcodone-acetaminophen 5-325 MG per tablet  Commonly known as:  NORCO  Take 1 tablet by mouth every 6 (six) hours as needed for moderate pain.     ibuprofen 200 MG tablet  Commonly known as:  ADVIL,MOTRIN  Take 400 mg by mouth every 6 (six) hours as needed (pain).     penicillin v potassium 500 MG tablet  Commonly known as:  VEETID  Take 1 tablet (500 mg total) by mouth 3 (three) times daily.        Followup:      Follow-up Information    Follow up with Bernadetta Roell A, MD.   Specialty:  Urology   Why:  For wound re-check   Contact information:   [address] [place] [address] [phone]

## 2014-09-20 NOTE — Discharge Instructions (Signed)
I have reviewed discharge instructions in detail with the patient. They will follow-up with me or their physician as scheduled. My nurse will also be calling the patients as per protocol.   

## 2014-09-20 NOTE — Progress Notes (Signed)
Looks great vtials ok Scrotum soft and supple both sides Incision good

## 2014-09-20 NOTE — Progress Notes (Signed)
Patient given discharge instructions and prescription. He was also give some supplies to go home with and instructed on where to get more. IV was removed. Patient is waiting on family member to pick him up.

## 2014-09-27 NOTE — Progress Notes (Signed)
UR chart review completed.  

## 2014-11-21 ENCOUNTER — Encounter (HOSPITAL_COMMUNITY): Payer: Self-pay | Admitting: *Deleted

## 2014-11-21 ENCOUNTER — Emergency Department (HOSPITAL_COMMUNITY)
Admission: EM | Admit: 2014-11-21 | Discharge: 2014-11-21 | Disposition: A | Discharge status: Home / Self Care | Payer: Self-pay | Attending: Emergency Medicine | Admitting: Emergency Medicine

## 2014-11-21 ENCOUNTER — Emergency Department (HOSPITAL_COMMUNITY): Payer: Self-pay

## 2014-11-21 DIAGNOSIS — Z8709 Personal history of other diseases of the respiratory system: Secondary | ICD-10-CM | POA: Insufficient documentation

## 2014-11-21 DIAGNOSIS — N50812 Left testicular pain: Secondary | ICD-10-CM

## 2014-11-21 DIAGNOSIS — N50819 Testicular pain, unspecified: Secondary | ICD-10-CM

## 2014-11-21 DIAGNOSIS — N508 Other specified disorders of male genital organs: Secondary | ICD-10-CM | POA: Insufficient documentation

## 2014-11-21 HISTORY — DX: Torsion of testis, unspecified: N44.00

## 2014-11-21 LAB — URINALYSIS, ROUTINE W REFLEX MICROSCOPIC
Bilirubin Urine: NEGATIVE
Glucose, UA: NEGATIVE mg/dL
Hgb urine dipstick: NEGATIVE
KETONES UR: NEGATIVE mg/dL
LEUKOCYTES UA: NEGATIVE
Nitrite: NEGATIVE
Protein, ur: NEGATIVE mg/dL
Specific Gravity, Urine: >1.03 — ABNORMAL HIGH (ref 1.005–1.030)
Urobilinogen, UA: 0.2 mg/dL (ref 0.0–1.0)
pH: 6 (ref 5.0–8.0)

## 2014-11-21 NOTE — Discharge Instructions (Signed)
Workup of the testicular discomfort without evidence of torsion and without evidence of infection. Return for any new or worse symptoms. Motrin can be taken for the discomfort.

## 2014-11-21 NOTE — ED Provider Notes (Addendum)
CSN: [id number]     Arrival date & time 11/21/14  1218 History   First MD Initiated Contact with Patient 11/21/14 1244     Chief Complaint  Patient presents with  . Testicle Pain     (Consider location/radiation/quality/duration/timing/severity/associated sxs/prior Treatment) Patient is a 19 y.o. male presenting with testicular pain. The history is provided by the patient.  Testicle Pain Pertinent negatives include no chest pain, no abdominal pain, no headaches and no shortness of breath.   patient status post right testicle removal for torsion of a few months ago. Now with 2 days of the left test to kill her pain sharp in nature no swelling not as severe as it was with the torsion. The patient is concerned. Denies any discharge fever any swelling in the area. Pain is intermittent and sharp at its worst it's 8 out of 10.  Past Medical History  Diagnosis Date  . Bronchitis   . Testicular torsion    Past Surgical History  Procedure Laterality Date  . Orchiectomy N/A 09/19/2014    Procedure: SCROTAL EXPLORATION,  RIGHT ORCHIECTOMY, LEFT ORCHIOPEXY;  Surgeon: Martina Sinner, MD;  Location: [facility];  Service: Urology;  Laterality: N/A;  . +     No family history on file. History  Substance Use Topics  . Smoking status: Never Smoker   . Smokeless tobacco: Not on file  . Alcohol Use: No    Review of Systems  Constitutional: Negative for fever.  HENT: Negative for congestion.   Eyes: Negative for visual disturbance.  Respiratory: Negative for shortness of breath.   Cardiovascular: Negative for chest pain.  Gastrointestinal: Negative for nausea, vomiting and abdominal pain.  Genitourinary: Positive for testicular pain. Negative for dysuria, hematuria and scrotal swelling.  Musculoskeletal: Negative for back pain.  Skin: Negative for rash.  Neurological: Negative for headaches.  Hematological: Does not bruise/bleed easily.  Psychiatric/Behavioral: Negative for confusion.       Allergies  Review of patient's allergies indicates no known allergies.  Home Medications   Prior to Admission medications   Medication Sig Start Date End Date Taking? Authorizing Provider  cephALEXin (KEFLEX) 500 MG capsule Take 1 capsule (500 mg total) by mouth 3 (three) times daily. Patient not taking: Reported on 11/21/2014 09/20/14   Alfredo Martinez, MD  HYDROcodone-acetaminophen (NORCO) 5-325 MG per tablet Take 1 tablet by mouth every 6 (six) hours as needed for moderate pain. Patient not taking: Reported on 11/21/2014 09/20/14   Alfredo Martinez, MD  penicillin v potassium (VEETID) 500 MG tablet Take 1 tablet (500 mg total) by mouth 3 (three) times daily. Patient not taking: Reported on 09/19/2014 05/26/14   Linwood Dibbles, MD   BP 137/69 mmHg  Pulse 84  Temp(Src) 98.9 F (37.2 C) (Oral)  Resp 14  Ht  (1.778 m)  Wt 143 lb (64.864 kg)  BMI 20.52 kg/m2  SpO2 98% Physical Exam  Constitutional: He is oriented to person, place, and time. He appears well-developed and well-nourished. No distress.  HENT:  Head: Normocephalic and atraumatic.  Mouth/Throat: Oropharynx is clear and moist.  Eyes: Conjunctivae and EOM are normal. Pupils are equal, round, and reactive to light.  Neck: Normal range of motion. Neck supple.  Cardiovascular: Normal rate, regular rhythm and normal heart sounds.   No murmur heard. Pulmonary/Chest: Effort normal and breath sounds normal.  Abdominal: Soft. Bowel sounds are normal. There is no tenderness.  Genitourinary: Penis normal.  Right testicle is been removed. Left testicle with mild tenderness  no episode did normal tenderness. No discharge from the penis. No hernia.  Musculoskeletal: Normal range of motion.  Neurological: He is alert and oriented to person, place, and time. No cranial nerve deficit. He exhibits normal muscle tone. Coordination normal.  Skin: Skin is warm. No rash noted.  Nursing note and vitals reviewed.   ED Course   Procedures (including critical care time) Labs Review Labs Reviewed  URINALYSIS, ROUTINE W REFLEX MICROSCOPIC - Abnormal; Notable for the following:    Specific Gravity, Urine >1.030 (*)    All other components within normal limits    Imaging Review [place] Scrotum  11/21/2014   CLINICAL DATA:  Left testicle pain. Previous right testicle torsion with secondary right orchidectomy.  EXAM: SCROTAL ULTRASOUND  DOPPLER ULTRASOUND OF THE TESTICLES  TECHNIQUE: Complete ultrasound examination of the testicles, epididymis, and other scrotal structures was performed. Color and spectral Doppler ultrasound were also utilized to evaluate blood flow to the testicles.  COMPARISON:  Ultrasound dated 09/19/2014  FINDINGS: Right testicle  Removed.  Left testicle  Measurements: 4.7 x 2.5 x 3.1 cm. No mass or microlithiasis visualized.  Left epididymis: 5 mm cyst in the left epididymal head. Otherwise normal.  Hydrocele:  None visualized.  Varicocele:  Small left varicocele.  Pulsed Doppler interrogation of the left testicle demonstrates normal low resistance arterial and venous waveforms.  IMPRESSION: Normal appearing left testicle with normal perfusion. Small cyst in the epididymal head. The scrotal skin is slightly thickened to 6 mm. Does the patient show any visual evidence of scrotal inflammation?   Electronically Signed   By: Francene BoyersJames  Maxwell M.D.   On: 11/21/2014 14:50   [place] Art/ven Flow Abd Pelv Doppler  11/21/2014   CLINICAL DATA:  Left testicle pain. Previous right testicle torsion with secondary right orchidectomy.  EXAM: SCROTAL ULTRASOUND  DOPPLER ULTRASOUND OF THE TESTICLES  TECHNIQUE: Complete ultrasound examination of the testicles, epididymis, and other scrotal structures was performed. Color and spectral Doppler ultrasound were also utilized to evaluate blood flow to the testicles.  COMPARISON:  Ultrasound dated 09/19/2014  FINDINGS: Right testicle  Removed.  Left testicle  Measurements: 4.7 x 2.5 x 3.1 cm. No  mass or microlithiasis visualized.  Left epididymis: 5 mm cyst in the left epididymal head. Otherwise normal.  Hydrocele:  None visualized.  Varicocele:  Small left varicocele.  Pulsed Doppler interrogation of the left testicle demonstrates normal low resistance arterial and venous waveforms.  IMPRESSION: Normal appearing left testicle with normal perfusion. Small cyst in the epididymal head. The scrotal skin is slightly thickened to 6 mm. Does the patient show any visual evidence of scrotal inflammation?   Electronically Signed   By: Francene BoyersJames  Maxwell M.D.   On: 11/21/2014 14:50     EKG Interpretation None      MDM   Final diagnoses:  Testicular pain, left    Patient with pain in the left testicle that began 2 days ago. Pain is mild however patient a few months ago had a right testicular torsion with removal of the right testicle. Urology apparently did tack down the left testicle. The patient is concerned. On exam some mild discomfort to the left testicle no significant swelling. Pain is not out of proportion doubt that this is a torsion but for peace of mind for the patient will get the ultrasound of the scrotal area. Urinalysis also pending.  Ultrasound results showed no evidence of torsion. Patient without any significant scrotal swelling on the left. Could be residual from the  operation on the right. The bump that the patient feels is consistent with the cyst on the epididymis.  Patient be discharged home well with the recommendation for Motrin and returning for any new or worse symptoms.   Vanetta Mulders, MD 11/21/14 1435  Vanetta Mulders, MD 11/21/14 1513

## 2014-11-21 NOTE — ED Notes (Signed)
MD at bedside. 

## 2014-11-21 NOTE — ED Notes (Signed)
Pt states left testicular pain began 2 days. Hx of testicular torsion a few months ago, requiring removal of the right testicle. Pt states intermittent sharp pain. Denies swelling.

## 2017-01-30 IMAGING — US US ART/VEN ABD/PELV/SCROTUM DOPPLER LTD
1 series · 13 of 25 positions shown · non-contrast
Comparison: None.

CLINICAL DATA: Right testicular swelling x2 days.

EXAM:
SCROTAL ULTRASOUND
DOPPLER ULTRASOUND OF THE TESTICLES
TECHNIQUE: Complete ultrasound examination of the testicles, epididymis, and
other scrotal structures was performed. Color and spectral Doppler
ultrasound were also utilized to evaluate blood flow to the
testicles.

[Series 1: us art/ven abd/pelv/scrotum doppler ltd · 0.08mm/px · 13 of 69 slices shown]
[im 1/69]
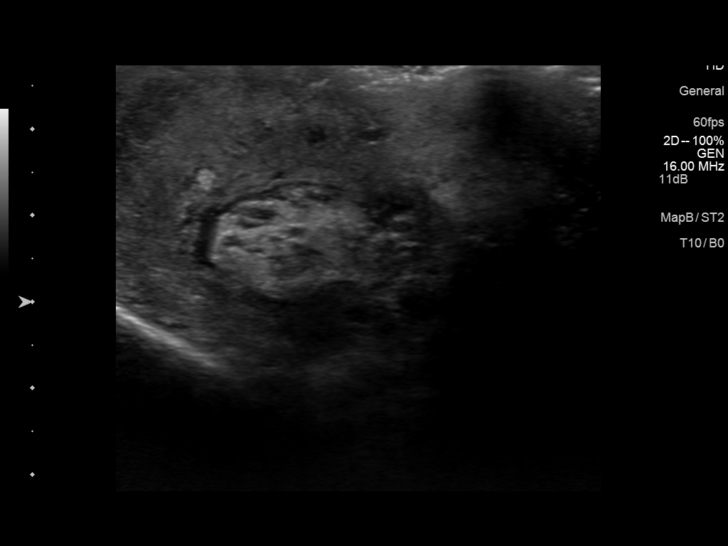
[im 6/69]
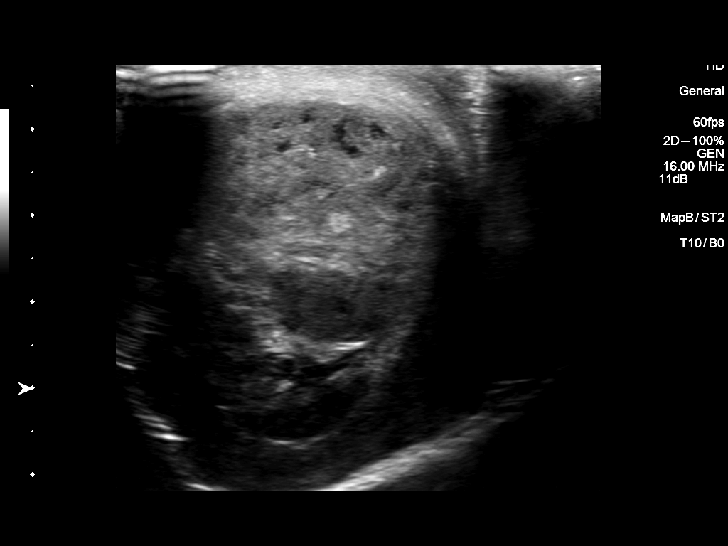
[im 12/69]
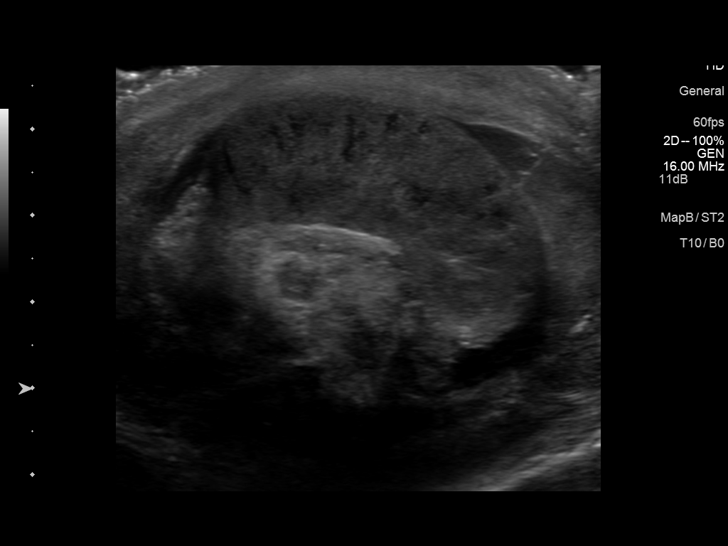
[im 18/69]
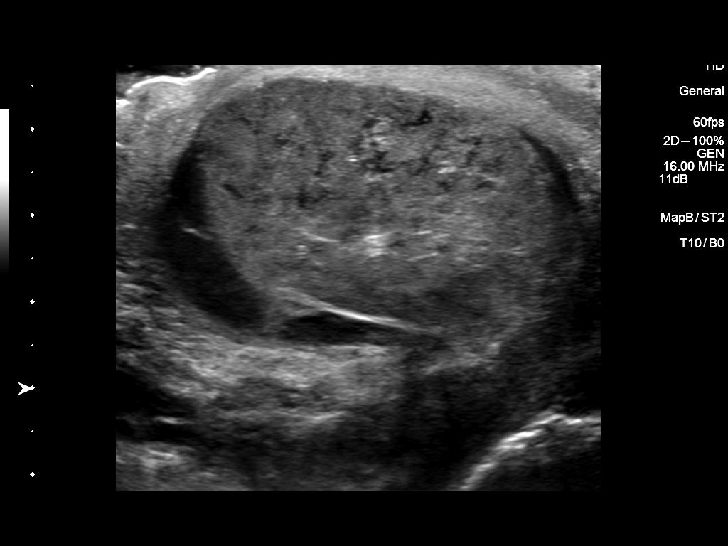
[im 23/69]
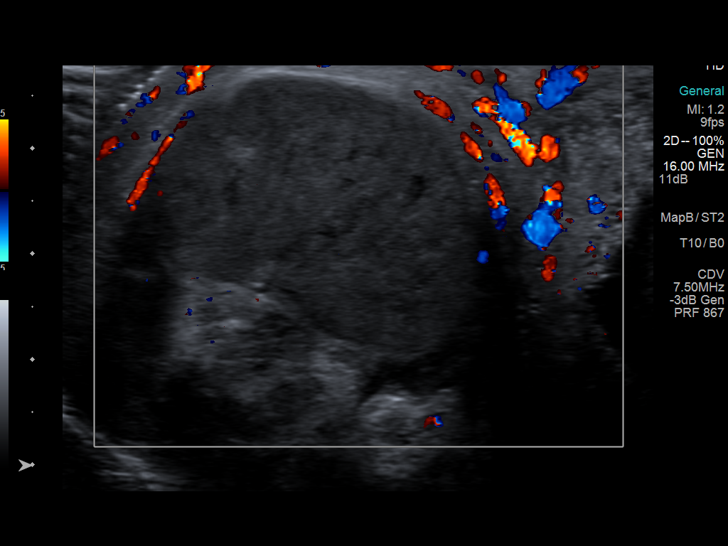
[im 29/69]
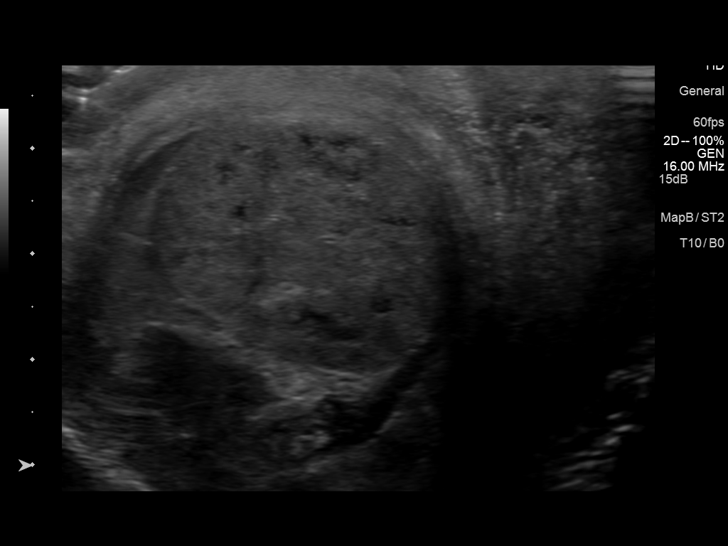
[im 35/69]
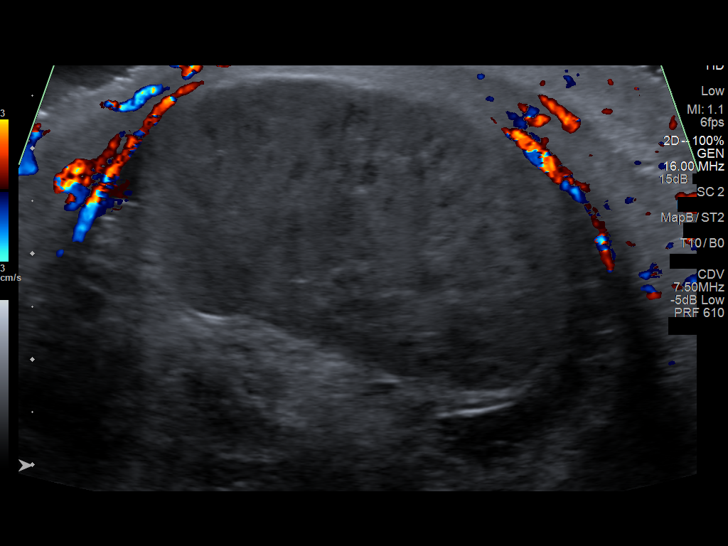
[im 40/69]
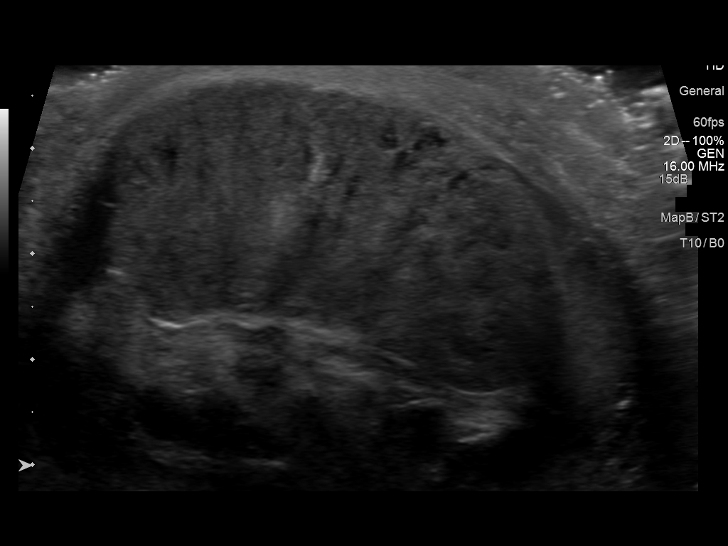
[im 46/69]
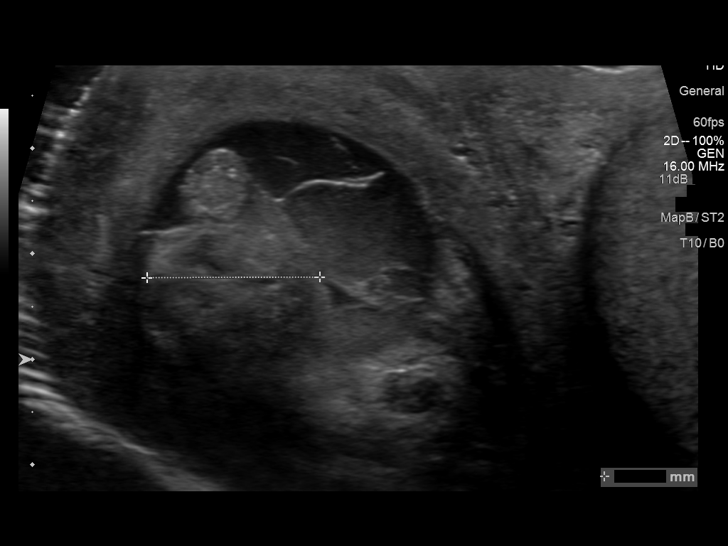
[im 52/69]
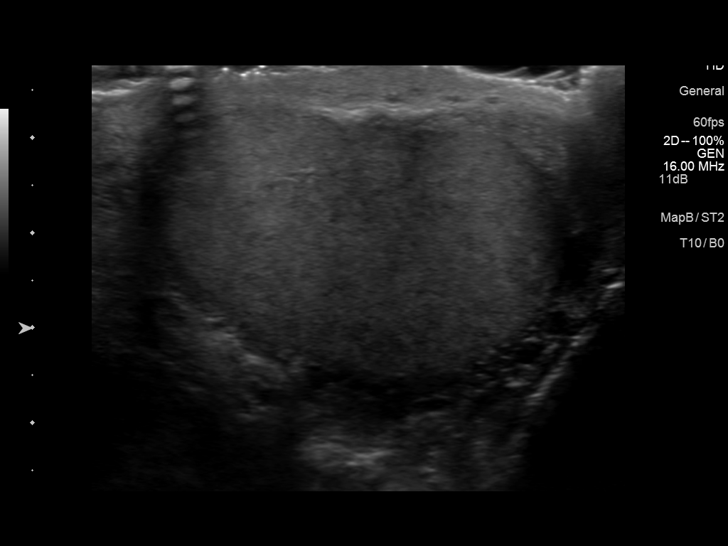
[im 57/69]
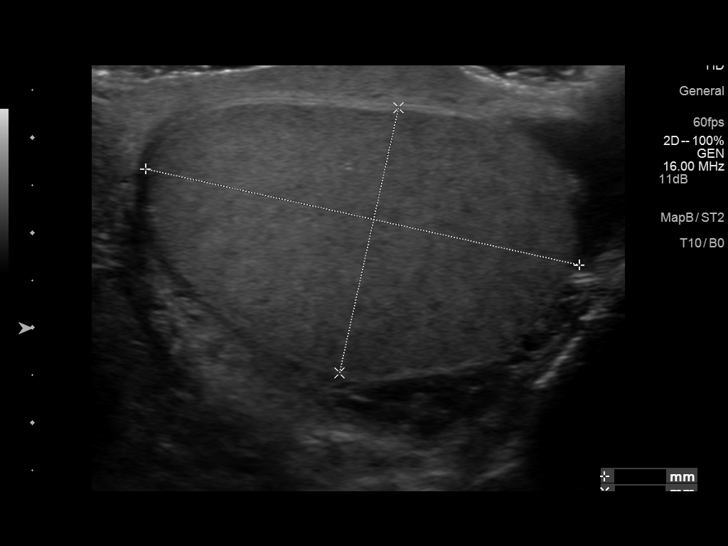
[im 63/69]
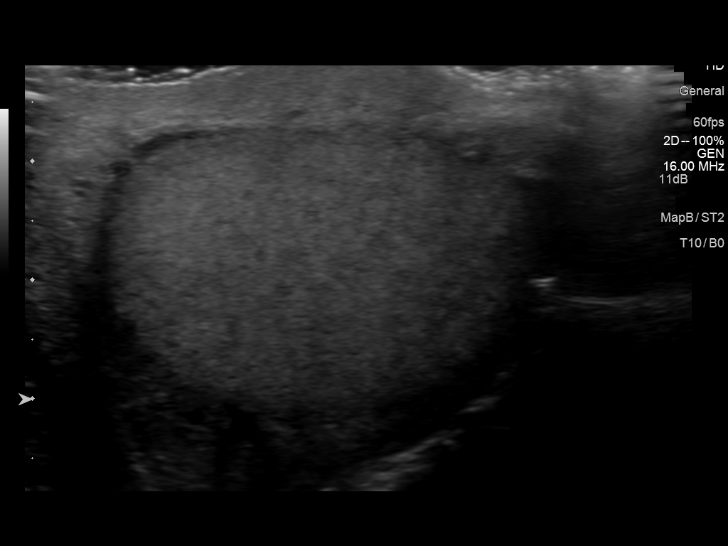
[im 69/69]
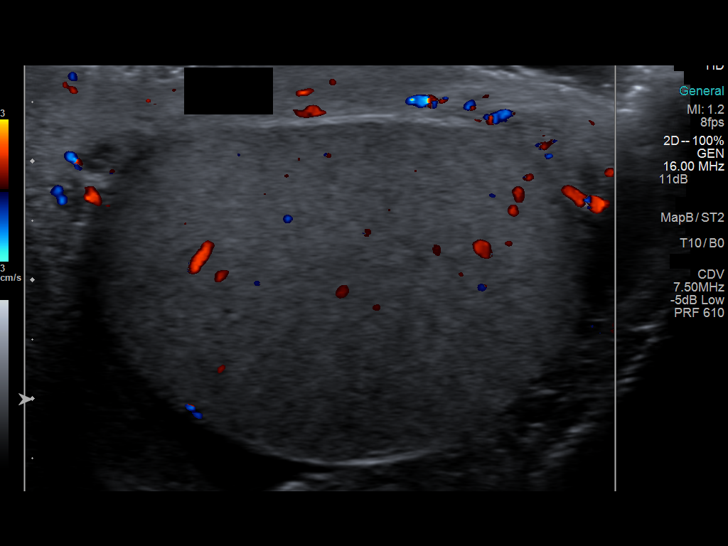

[13 of 25 positions shown; findings below may reference images not displayed]

FINDINGS: Right testicle

Measurements: 4.9 x 2.6 x 3.5 cm. Heterogeneous echotexture noted
throughout the right testicle with testicular edema. Scrotal edema,
epididymal edema, a right hydrocele with debris and septations
noted. No internal blood flow noted in the testicle. Findings are
consistent with testicular torsion. Infiltrating tumor and/or
infectious process cannot be excluded.

Left testicle

Measurements: 4.7 x 2.9 x 4.5 cm. No mass or microlithiasis
visualized.

Right epididymis:  As above.

Left epididymis:  Normal in size and appearance.

Hydrocele:  As above.  No hydrocele on the left.

Varicocele:  None visualized.

Pulsed Doppler interrogation of both testes demonstrates no flow
noted within the right testicle as noted above. Normal left
testicular flow.
IMPRESSION: Grossly abnormal right testicle with heterogeneous testicular
echotexture with no internal blood flow. Right scrotal edema,
epididymal edema, and complex hydrocele with debris and septations
noted. These findings are consistent with testicular torsion.
Infiltrating tumor and/or infectious process cannot be excluded.
These results were called by telephone at the time of interpretation
on 09/19/2014 at [DATE] to Dr. TAMMARA RAMIRO , who verbally
acknowledged these results.

## 2017-04-03 IMAGING — US US SCROTUM
1 series · 14 of 25 positions shown · non-contrast
Comparison: Ultrasound dated 09/19/2014

CLINICAL DATA: Left testicle pain. Previous right testicle torsion
with secondary right orchidectomy.

EXAM:
SCROTAL ULTRASOUND
DOPPLER ULTRASOUND OF THE TESTICLES
TECHNIQUE: Complete ultrasound examination of the testicles, epididymis, and
other scrotal structures was performed. Color and spectral Doppler
ultrasound were also utilized to evaluate blood flow to the
testicles.

[Series 1: us scrotum · 0.06mm/px · 14 of 50 slices shown]
[im 1/50]
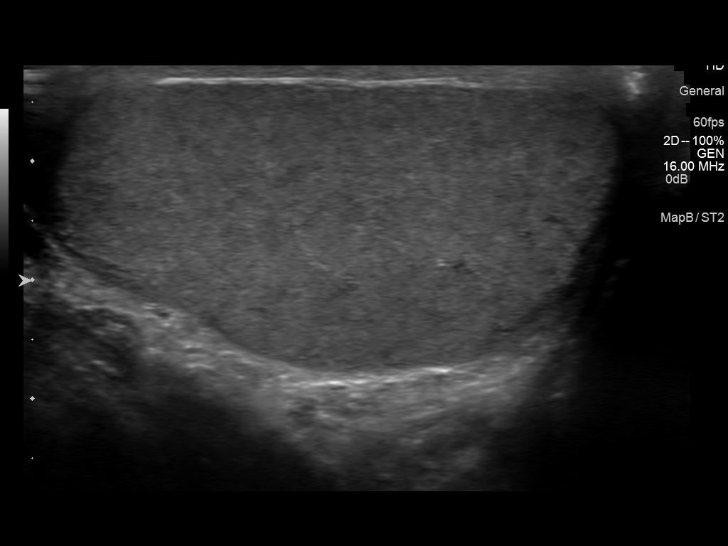
[im 5/50]
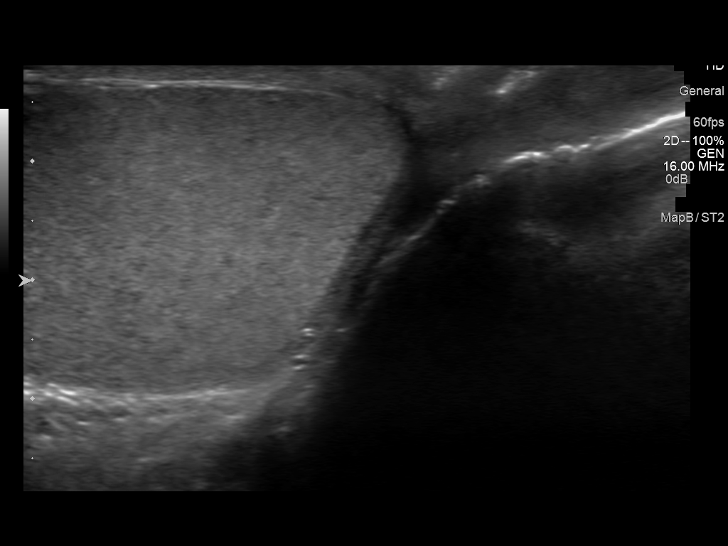
[im 9/50]
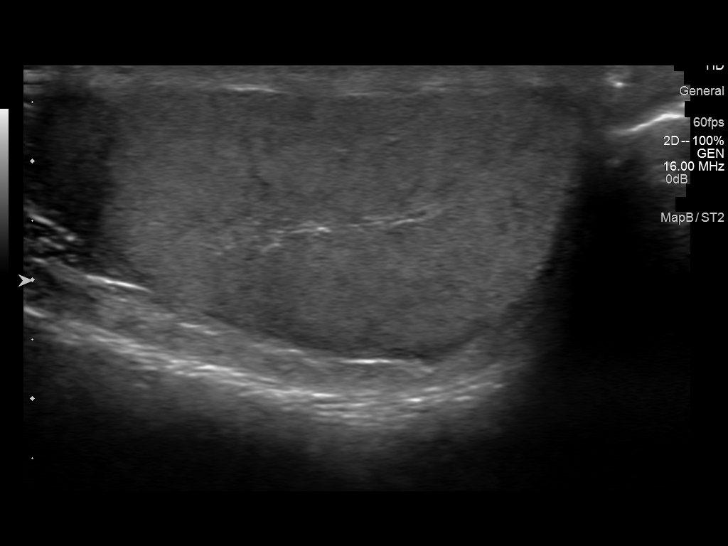
[im 13/50]
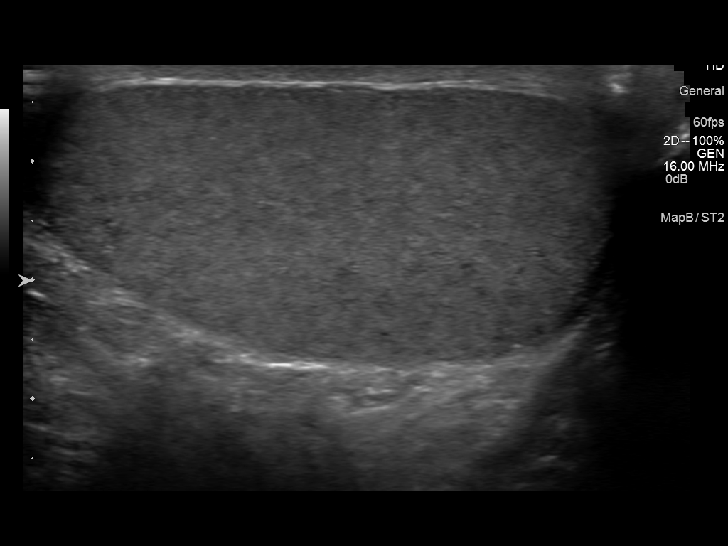
[im 17/50]
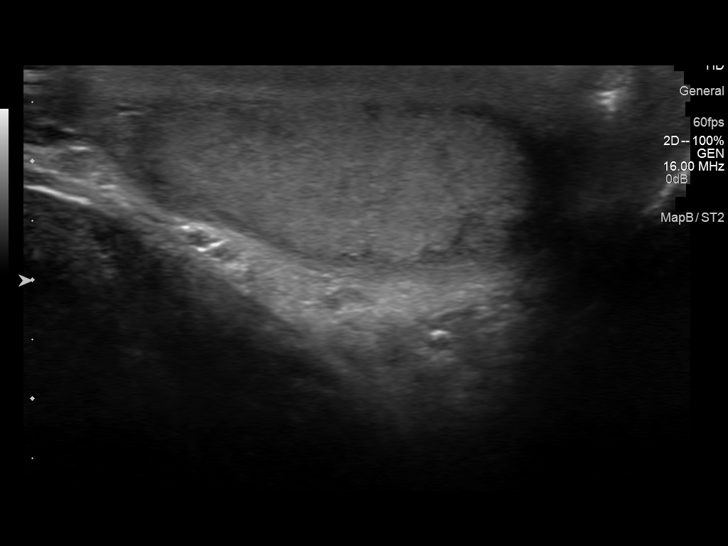
[im 19/50]
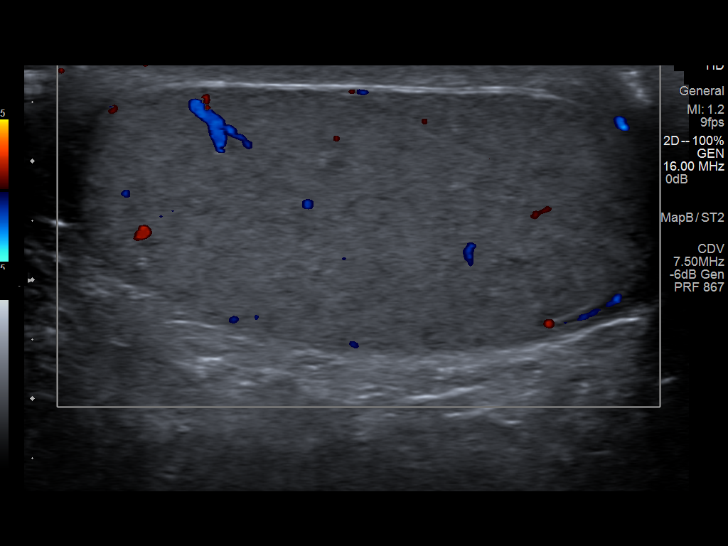
[im 23/50]
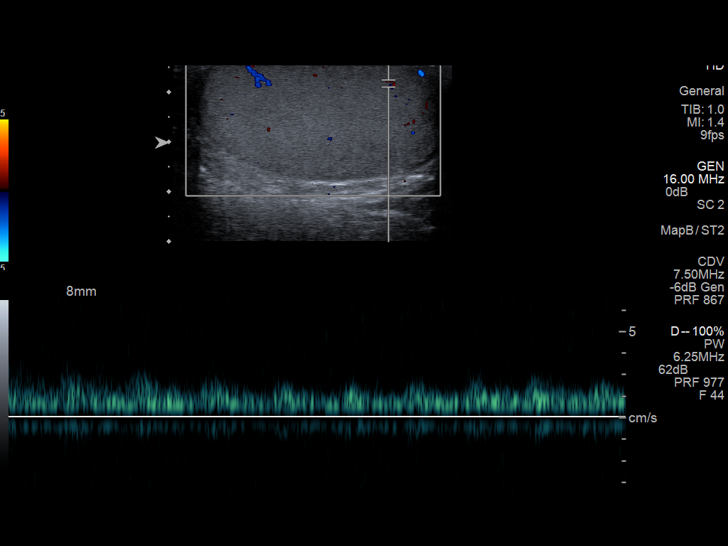
[im 27/50]
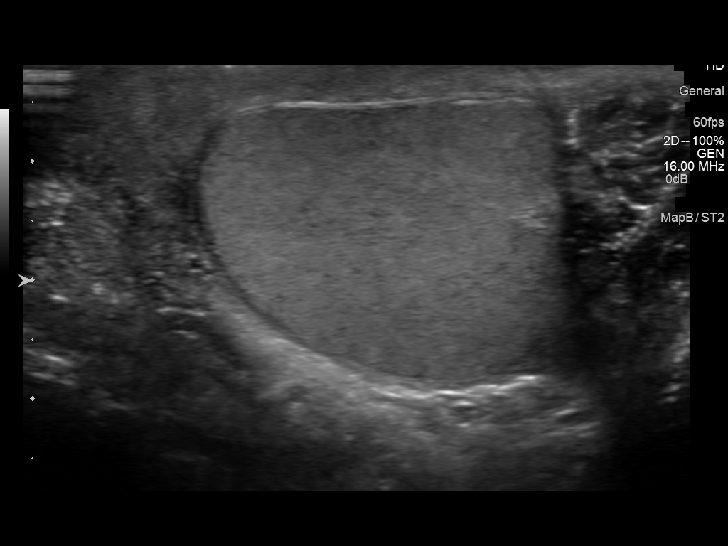
[im 31/50]
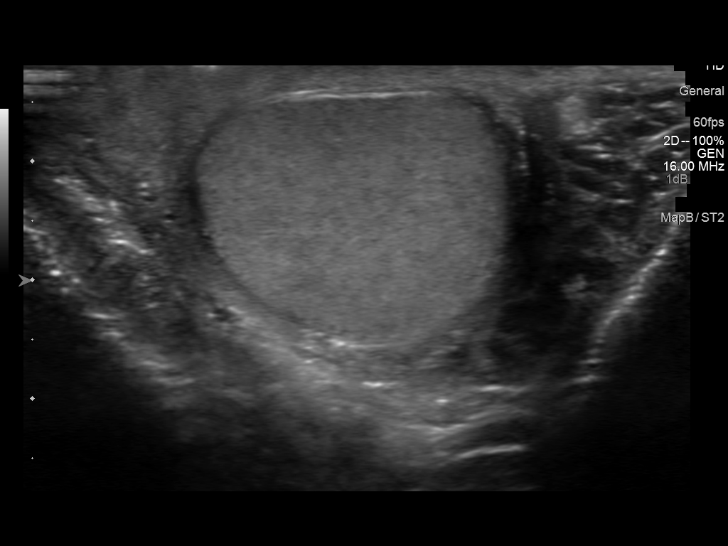
[im 33/50]
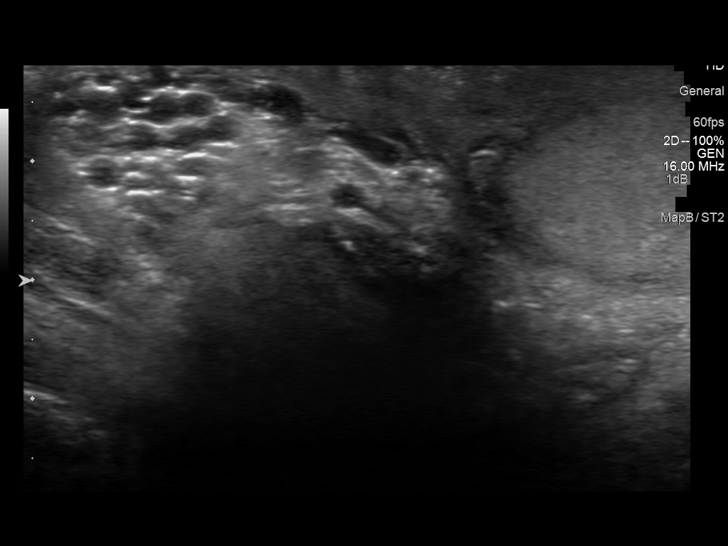
[im 37/50]
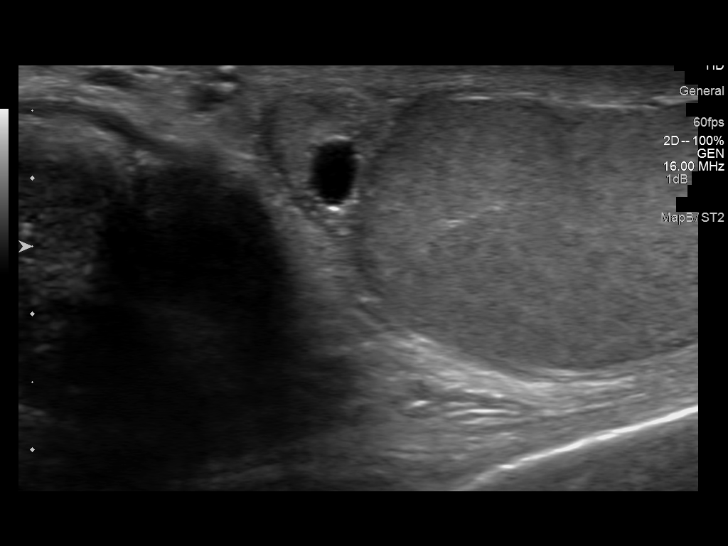
[im 41/50]
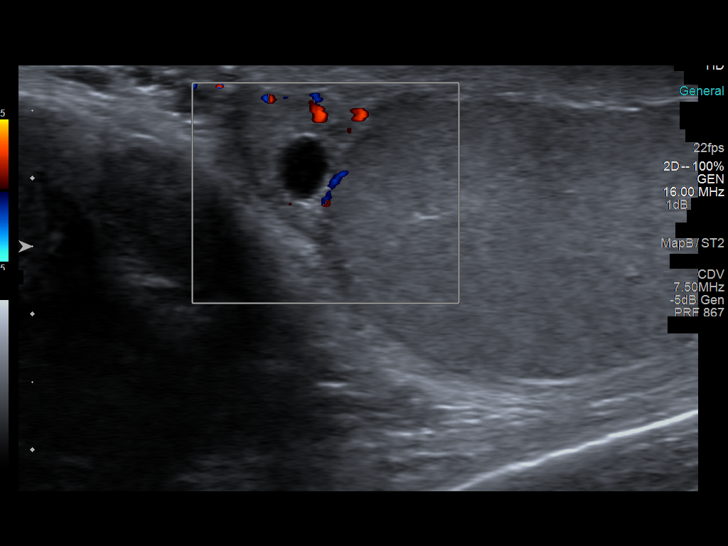
[im 45/50]
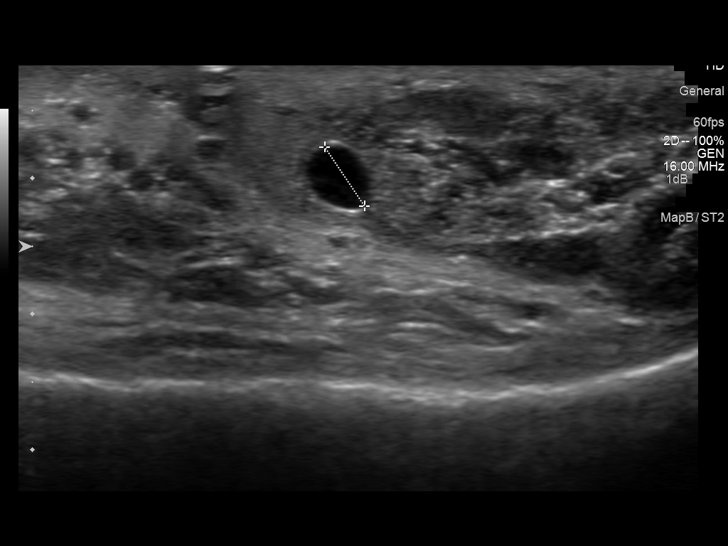
[im 50/50]
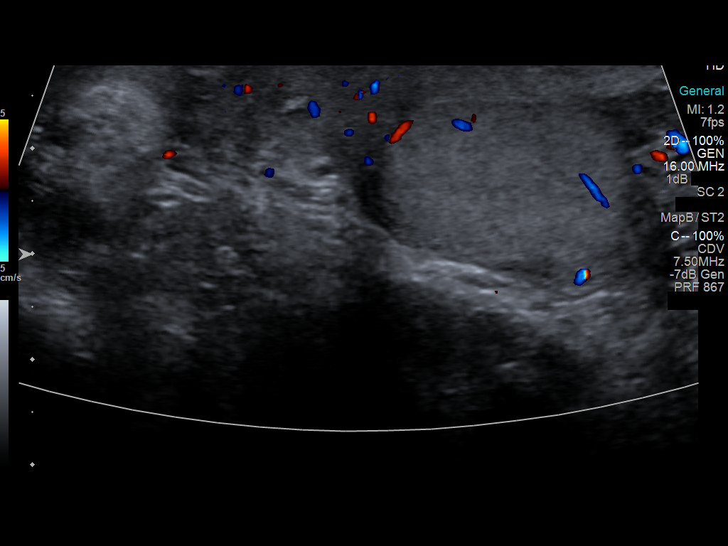

[14 of 25 positions shown; findings below may reference images not displayed]

FINDINGS: Right testicle

Removed.

Left testicle

Measurements: 4.7 x 2.5 x 3.1 cm. No mass or microlithiasis
visualized.

Left epididymis: 5 mm cyst in the left epididymal head. Otherwise
normal.

Hydrocele:  None visualized.

Varicocele:  Small left varicocele.

Pulsed Doppler interrogation of the left testicle demonstrates
normal low resistance arterial and venous waveforms.
IMPRESSION: Normal appearing left testicle with normal perfusion. Small cyst in
the epididymal head. The scrotal skin is slightly thickened to 6 mm.
Does the patient show any visual evidence of scrotal inflammation?

## 2018-01-05 ENCOUNTER — Emergency Department (HOSPITAL_COMMUNITY)
Admission: EM | Admit: 2018-01-05 | Discharge: 2018-01-05 | Disposition: A | Discharge status: Home / Self Care | Payer: Self-pay | Attending: Emergency Medicine | Admitting: Emergency Medicine

## 2018-01-05 ENCOUNTER — Other Ambulatory Visit: Payer: Self-pay

## 2018-01-05 ENCOUNTER — Encounter (HOSPITAL_COMMUNITY): Payer: Self-pay | Admitting: Emergency Medicine

## 2018-01-05 DIAGNOSIS — K0889 Other specified disorders of teeth and supporting structures: Secondary | ICD-10-CM | POA: Insufficient documentation

## 2018-01-05 MED ORDER — AMOXICILLIN 500 MG PO CAPS: 500.0000 mg | ORAL_CAPSULE | Freq: Three times a day (TID) | ORAL | 0 refills | Status: AC

## 2018-01-05 MED ORDER — DICLOFENAC SODIUM 75 MG PO TBEC: 75.0000 mg | DELAYED_RELEASE_TABLET | Freq: Two times a day (BID) | ORAL | 0 refills | Status: AC

## 2018-01-05 NOTE — ED Provider Notes (Signed)
[facility]GENCY DEPARTMENT Provider Note   CSN: [id number] Arrival date & time: 01/05/18  0940     History   Chief Complaint Chief Complaint  Patient presents with  . Dental Pain    HPI Eric PerlaJaquan S Collins is a 22 y.o. male.  The history is provided by the patient. No language interpreter was used.  Dental Pain   This is a new problem. The current episode started more than 2 days ago. The problem occurs constantly. The problem has been gradually worsening. The pain is at a severity of 5/10. The pain is moderate. He has tried nothing for the symptoms. The treatment provided no relief.  Pt reports he has a bad tooth.  Pt reports he has had problemsn with infection in the past.   Past Medical History:  Diagnosis Date  . Bronchitis   . Testicular torsion     Patient Active Problem List   Diagnosis Date Noted  . 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 CLOS FRACTURE MID/PROXIMAL PHALANX/PHALANG HAND 02/14/2008    Past Surgical History:  Procedure Laterality Date  . +    . ORCHIECTOMY N/A 09/19/2014   Procedure: SCROTAL EXPLORATION,  RIGHT ORCHIECTOMY, LEFT ORCHIOPEXY;  Surgeon: Martina SinnerScott A MacDiarmid, MD;  Location: [facility];  Service: Urology;  Laterality: N/A;        Home Medications    Prior to Admission medications   Medication Sig Start Date End Date Taking? Authorizing Provider  amoxicillin (AMOXIL) 500 MG capsule Take 1 capsule (500 mg total) by mouth 3 (three) times daily. 01/05/18   Elson AreasSofia, Ladonya Jerkins K, PA-C  cephALEXin (KEFLEX) 500 MG capsule Take 1 capsule (500 mg total) by mouth 3 (three) times daily. Patient not taking: Reported on 11/21/2014 09/20/14   Alfredo MartinezMacDiarmid, Scott, MD  diclofenac (VOLTAREN) 75 MG EC tablet Take 1 tablet (75 mg total) by mouth 2 (two) times daily. 01/05/18   Elson AreasSofia, Mylen Mangan K, PA-C  HYDROcodone-acetaminophen (NORCO) 5-325 MG per tablet Take 1 tablet by mouth every 6 (six) hours as needed for moderate pain. Patient not taking: Reported on 11/21/2014 09/20/14    Alfredo MartinezMacDiarmid, Scott, MD  penicillin v potassium (VEETID) 500 MG tablet Take 1 tablet (500 mg total) by mouth 3 (three) times daily. Patient not taking: Reported on 09/19/2014 05/26/14   Linwood DibblesKnapp, Jon, MD    Family History History reviewed. No pertinent family history.  Social History Social History   Tobacco Use  . Smoking status: Never Smoker  Substance Use Topics  . Alcohol use: No  . Drug use: No     Allergies   Patient has no known allergies.   Review of Systems Review of Systems  All other systems reviewed and are negative.    Physical Exam Updated Vital Signs BP (!) 134/99 (BP Location: Right Arm)   Pulse 86   Temp 98.4 F (36.9 C) (Oral)   Resp 18   Ht 6' (1.829 m)   Wt 79.4 kg (175 lb)   SpO2 100%   BMI 23.73 kg/m   Physical Exam  Constitutional: He appears well-developed and well-nourished.  HENT:  Head: Normocephalic.  Right Ear: External ear normal.  Left Ear: External ear normal.  Swelling right lower jawline,  Swelling around 1st molar lower right,   Eyes: Pupils are equal, round, and reactive to light.  Neck: Normal range of motion.  Cardiovascular: Normal rate.  Pulmonary/Chest: Effort normal.  Musculoskeletal: Normal range of motion.  Neurological: He is alert.  Skin: Skin is warm.  Psychiatric: He has a normal mood and affect.  Nursing note and vitals reviewed.    ED Treatments / Results  Labs (all labs ordered are listed, but only abnormal results are displayed) Labs Reviewed - No data to display  EKG None  Radiology No results found.  Procedures Procedures (including critical care time)  Medications Ordered in ED Medications - No data to display   Initial Impression / Assessment and Plan / ED Course  I have reviewed the triage vital signs and the nursing notes.  Pertinent labs & imaging results that were available during my care of the patient were reviewed by me and considered in my medical decision making (see chart for  details).     Pt counseled on dental infection.  Pt advised to use dental resource guide to help find dental treatment   Final Clinical Impressions(s) / ED Diagnoses   Final diagnoses:  Toothache    ED Discharge Orders        Ordered    amoxicillin (AMOXIL) 500 MG capsule  3 times daily     01/05/18 0956    diclofenac (VOLTAREN) 75 MG EC tablet  2 times daily     01/05/18 0956       Elson Areas, PA-C 01/05/18 1040    Samuel Jester, DO 01/09/18 2258

## 2018-01-05 NOTE — ED Triage Notes (Signed)
Bottom right toothache for two days. Has dental caries needs pulled has no dentist.

## 2018-01-05 NOTE — Discharge Instructions (Signed)
Return if any problems.

## 2020-06-08 ENCOUNTER — Other Ambulatory Visit: Payer: Self-pay

## 2020-06-08 ENCOUNTER — Emergency Department (HOSPITAL_COMMUNITY)
Admission: EM | Admit: 2020-06-08 | Discharge: 2020-06-08 | Disposition: A | Discharge status: Home / Self Care | Payer: BC Managed Care – PPO
